# Patient Record
Sex: Female | Born: 1940
Health system: Southern US, Community
[De-identification: ages and names within clinical notes are randomized; demographics above are authoritative.]

## PROBLEM LIST (undated history)

## (undated) DIAGNOSIS — J45909 Unspecified asthma, uncomplicated: Secondary | ICD-10-CM

## (undated) DIAGNOSIS — R519 Headache, unspecified: Secondary | ICD-10-CM

## (undated) DIAGNOSIS — R51 Headache: Secondary | ICD-10-CM

## (undated) DIAGNOSIS — H539 Unspecified visual disturbance: Secondary | ICD-10-CM

## (undated) DIAGNOSIS — T4145XA Adverse effect of unspecified anesthetic, initial encounter: Secondary | ICD-10-CM

## (undated) DIAGNOSIS — T8859XA Other complications of anesthesia, initial encounter: Secondary | ICD-10-CM

## (undated) DIAGNOSIS — R000 Tachycardia, unspecified: Secondary | ICD-10-CM

## (undated) HISTORY — DX: Headache: R51

## (undated) HISTORY — DX: Headache, unspecified: R51.9

## (undated) HISTORY — PX: BREAST LUMPECTOMY: SHX2

## (undated) HISTORY — PX: HAND SURGERY: SHX662

## (undated) HISTORY — PX: TUBAL LIGATION: SHX77

## (undated) HISTORY — PX: OTHER SURGICAL HISTORY: SHX169

## (undated) HISTORY — DX: Unspecified visual disturbance: H53.9

## (undated) HISTORY — PX: FOOT SURGERY: SHX648

---

## 1998-03-17 ENCOUNTER — Ambulatory Visit (HOSPITAL_COMMUNITY): Admission: RE | Admit: 1998-03-17 | Discharge: 1998-03-17 | Payer: Self-pay | Admitting: Internal Medicine

## 1998-03-17 ENCOUNTER — Encounter: Payer: Self-pay | Admitting: Internal Medicine

## 1999-03-18 ENCOUNTER — Encounter: Payer: Self-pay | Admitting: Internal Medicine

## 1999-03-18 ENCOUNTER — Ambulatory Visit (HOSPITAL_COMMUNITY): Admission: RE | Admit: 1999-03-18 | Discharge: 1999-03-18 | Payer: Self-pay | Admitting: Internal Medicine

## 2000-01-13 ENCOUNTER — Encounter: Admission: RE | Admit: 2000-01-13 | Discharge: 2000-01-13 | Payer: Self-pay | Admitting: Internal Medicine

## 2000-01-13 ENCOUNTER — Encounter: Payer: Self-pay | Admitting: Internal Medicine

## 2000-10-01 ENCOUNTER — Other Ambulatory Visit: Admission: RE | Admit: 2000-10-01 | Discharge: 2000-10-01 | Payer: Self-pay | Admitting: Obstetrics and Gynecology

## 2000-11-15 ENCOUNTER — Ambulatory Visit (HOSPITAL_COMMUNITY): Admission: RE | Admit: 2000-11-15 | Discharge: 2000-11-15 | Payer: Self-pay | Admitting: Obstetrics and Gynecology

## 2000-11-15 ENCOUNTER — Encounter: Payer: Self-pay | Admitting: Obstetrics and Gynecology

## 2001-11-19 ENCOUNTER — Other Ambulatory Visit: Admission: RE | Admit: 2001-11-19 | Discharge: 2001-11-19 | Payer: Self-pay | Admitting: Obstetrics and Gynecology

## 2002-12-29 ENCOUNTER — Other Ambulatory Visit: Admission: RE | Admit: 2002-12-29 | Discharge: 2002-12-29 | Payer: Self-pay | Admitting: Obstetrics and Gynecology

## 2003-01-31 ENCOUNTER — Emergency Department (HOSPITAL_COMMUNITY): Admission: EM | Admit: 2003-01-31 | Discharge: 2003-01-31 | Payer: Self-pay | Admitting: Emergency Medicine

## 2003-02-01 ENCOUNTER — Emergency Department (HOSPITAL_COMMUNITY): Admission: EM | Admit: 2003-02-01 | Discharge: 2003-02-01 | Payer: Self-pay | Admitting: Emergency Medicine

## 2004-06-16 ENCOUNTER — Encounter: Admission: RE | Admit: 2004-06-16 | Discharge: 2004-06-16 | Payer: Self-pay | Admitting: Orthopedic Surgery

## 2005-04-07 ENCOUNTER — Ambulatory Visit (HOSPITAL_BASED_OUTPATIENT_CLINIC_OR_DEPARTMENT_OTHER): Admission: RE | Admit: 2005-04-07 | Discharge: 2005-04-07 | Payer: Self-pay | Admitting: Orthopedic Surgery

## 2009-08-29 ENCOUNTER — Other Ambulatory Visit: Payer: Self-pay | Admitting: Emergency Medicine

## 2009-08-29 ENCOUNTER — Encounter: Payer: Self-pay | Admitting: Emergency Medicine

## 2009-08-29 ENCOUNTER — Ambulatory Visit: Payer: Self-pay | Admitting: Diagnostic Radiology

## 2009-08-30 ENCOUNTER — Inpatient Hospital Stay (HOSPITAL_COMMUNITY): Admission: EM | Admit: 2009-08-30 | Discharge: 2009-09-03 | Payer: Self-pay | Admitting: Emergency Medicine

## 2009-08-31 ENCOUNTER — Ambulatory Visit: Payer: Self-pay | Admitting: Internal Medicine

## 2010-06-05 LAB — BASIC METABOLIC PANEL
BUN: 5 mg/dL — ABNORMAL LOW (ref 6–23)
BUN: 6 mg/dL (ref 6–23)
CO2: 29 mEq/L (ref 19–32)
Calcium: 8.8 mg/dL (ref 8.4–10.5)
Calcium: 8.9 mg/dL (ref 8.4–10.5)
Creatinine, Ser: 0.67 mg/dL (ref 0.4–1.2)
GFR calc Af Amer: >60 mL/min (ref >60)
GFR calc non Af Amer: >60 mL/min (ref >60)
GFR calc non Af Amer: >60 mL/min (ref >60)
Glucose, Bld: 160 mg/dL — ABNORMAL HIGH (ref 70–99)
Glucose, Bld: 164 mg/dL — ABNORMAL HIGH (ref 70–99)
Potassium: 3.7 mEq/L (ref 3.5–5.1)
Sodium: 137 mEq/L (ref 135–145)

## 2010-06-05 LAB — GLUCOSE, CAPILLARY
Glucose-Capillary: 126 mg/dL — ABNORMAL HIGH (ref 70–99)
Glucose-Capillary: 150 mg/dL — ABNORMAL HIGH (ref 70–99)
Glucose-Capillary: 175 mg/dL — ABNORMAL HIGH (ref 70–99)
Glucose-Capillary: 180 mg/dL — ABNORMAL HIGH (ref 70–99)
Glucose-Capillary: 195 mg/dL — ABNORMAL HIGH (ref 70–99)
Glucose-Capillary: 224 mg/dL — ABNORMAL HIGH (ref 70–99)
Glucose-Capillary: 265 mg/dL — ABNORMAL HIGH (ref 70–99)

## 2010-06-05 LAB — AFB CULTURE WITH SMEAR (NOT AT ARMC)

## 2010-06-05 LAB — CBC
HCT: 34.7 % — ABNORMAL LOW (ref 36.0–46.0)
MCHC: 34 g/dL (ref 30.0–36.0)
MCHC: 34.5 g/dL (ref 30.0–36.0)
MCHC: 34.5 g/dL (ref 30.0–36.0)
MCV: 88.9 fL (ref 78.0–100.0)
Platelets: 112 10*3/uL — ABNORMAL LOW (ref 150–400)
Platelets: 120 10*3/uL — ABNORMAL LOW (ref 150–400)
RDW: 13.2 % (ref 11.5–15.5)
RDW: 13.2 % (ref 11.5–15.5)
RDW: 13.3 % (ref 11.5–15.5)

## 2010-06-05 LAB — CARDIAC PANEL(CRET KIN+CKTOT+MB+TROPI)
CK, MB: 1.8 ng/mL (ref 0.3–4.0)
Relative Index: INVALID (ref 0.0–2.5)
Total CK: 52 U/L (ref 7–177)
Total CK: 77 U/L (ref 7–177)
Troponin I: 0.01 ng/mL (ref 0.00–0.06)

## 2010-06-05 LAB — FUNGUS CULTURE W SMEAR: Fungal Smear: NONE SEEN

## 2010-06-05 LAB — WOUND CULTURE: Culture: NO GROWTH

## 2010-06-05 LAB — ANAEROBIC CULTURE

## 2010-06-06 LAB — DIFFERENTIAL
Basophils Absolute: 0 10*3/uL (ref 0.0–0.1)
Basophils Relative: 0 % (ref 0–1)
Basophils Relative: 1 % (ref 0–1)
Eosinophils Absolute: 0.1 10*3/uL (ref 0.0–0.7)
Eosinophils Absolute: 0.1 10*3/uL (ref 0.0–0.7)
Eosinophils Relative: 2 % (ref 0–5)
Monocytes Absolute: 0.4 10*3/uL (ref 0.1–1.0)
Monocytes Relative: 6 % (ref 3–12)
Neutrophils Relative %: 54 % (ref 43–77)

## 2010-06-06 LAB — CULTURE, ROUTINE-ABSCESS

## 2010-06-06 LAB — CBC
HCT: 41.3 % (ref 36.0–46.0)
Hemoglobin: 11.7 g/dL — ABNORMAL LOW (ref 12.0–15.0)
MCHC: 33.8 g/dL (ref 30.0–36.0)
MCHC: 33 g/dL (ref 30.0–36.0)
MCHC: 33.5 g/dL (ref 30.0–36.0)
MCV: 89.3 fL (ref 78.0–100.0)
MCV: 87.3 fL (ref 78.0–100.0)
MCV: 88.2 fL (ref 78.0–100.0)
Platelets: 151 10*3/uL (ref 150–400)
RBC: 3.67 MIL/uL — ABNORMAL LOW (ref 3.87–5.11)
RBC: 3.89 MIL/uL (ref 3.87–5.11)
RBC: 4.57 MIL/uL (ref 3.87–5.11)
RDW: 13.3 % (ref 11.5–15.5)
RDW: 12.4 % (ref 11.5–15.5)
WBC: 6.9 10*3/uL (ref 4.0–10.5)

## 2010-06-06 LAB — BASIC METABOLIC PANEL
BUN: 15 mg/dL (ref 6–23)
BUN: 17 mg/dL (ref 6–23)
CO2: 30 mEq/L (ref 19–32)
Chloride: 102 mEq/L (ref 96–112)
Chloride: 103 mEq/L (ref 96–112)
Creatinine, Ser: 0.8 mg/dL (ref 0.4–1.2)
GFR calc Af Amer: >60 mL/min (ref >60)
Glucose, Bld: 189 mg/dL — ABNORMAL HIGH (ref 70–99)
Potassium: 4.8 mEq/L (ref 3.5–5.1)

## 2010-06-06 LAB — GLUCOSE, CAPILLARY
Glucose-Capillary: 107 mg/dL — ABNORMAL HIGH (ref 70–99)
Glucose-Capillary: 138 mg/dL — ABNORMAL HIGH (ref 70–99)
Glucose-Capillary: 152 mg/dL — ABNORMAL HIGH (ref 70–99)
Glucose-Capillary: 180 mg/dL — ABNORMAL HIGH (ref 70–99)
Glucose-Capillary: 207 mg/dL — ABNORMAL HIGH (ref 70–99)

## 2010-06-06 LAB — CULTURE, BLOOD (ROUTINE X 2)

## 2010-06-06 LAB — AFB CULTURE WITH SMEAR (NOT AT ARMC): Acid Fast Smear: NONE SEEN

## 2010-06-06 LAB — ANAEROBIC CULTURE: Gram Stain: NONE SEEN

## 2010-06-06 LAB — FUNGUS CULTURE W SMEAR

## 2010-08-05 NOTE — Op Note (Signed)
NAME:  Anna Norris, Anna Norris                 ACCOUNT NO.:  [id number]   MEDICAL RECORD NO.:  [id number]          PATIENT TYPE:  AMB   LOCATION:  DSC                          FACILITY:  [facility]   PHYSICIAN:  Harvie Junior, M.D.   DATE OF BIRTH:  March 13, 1941   DATE OF PROCEDURE:  04/07/2005  DATE OF DISCHARGE:                                 OPERATIVE REPORT   PREOPERATIVE DIAGNOSIS:  1.  Peroneal nerve impingement, left.  2.  Morton's neuroma 2-3 interspace.  3.  Morton's neuroma 3-4 interspace.   POSTOPERATIVE DIAGNOSIS:  1.  Peroneal nerve impingement, left.  2.  Morton's neuroma 2-3 interspace.  3.  Morton's neuroma 3-4 interspace.   PROCEDURE:  1.  Peroneal nerve decompression, left.  2.  Excision of Morton's neuroma 2-3 interspace.  3.  Excision of Morton's neuroma 3-4 interspace.   SURGEON:  Harvie Junior, M.D.   ASSISTANT:  Marshia Ly, P.A.-C.   ANESTHESIA:  General .   BRIEF HISTORY:  70 year old female with a long history of having significant  lateral and dorsal foot pain.  She ultimately had been evaluated and for a  long period of time treated conservatively.  She had had multiple injections  in the 2-3 and 3-4 interspace because of neuromatous type pain.  She also  had had proximal pain which completely did not make sense.  We ultimately  evaluated her with EMG and EMG showed that she had compression of the  peroneal nerve.  We felt that this needed to be decompressed and she was  brought to the operating room for this procedure.   DESCRIPTION OF PROCEDURE:  The patient was taken to the operating room and  after adequate anesthesia was obtained with a general anesthetic, the  patient was placed supine upon the operating table.  The left leg was  prepped and draped in the usual sterile fashion.  Following this, the leg  was exsanguinated and blood pressure tourniquet was inflated to 300 mmHg.  Following this, a curved incision was made just distal to the head of the  fibula and subcutaneous tissue were dissected down to the level of the  investing fascia.  The nerve was palpated below the fascia.  A small rent  was made in the fascia and then the scissors were used to open the fascia  proximally and distally.  There was a portion of hamstring tendon in this  area that did appear to be compressing the nerve, especially as it came into  full extension and this was released somewhat on the lateral side.  The  nerve was tracked proximally well into the popliteal space and, at this  point, the nerve was completely free distally and there were some adhesions  distally through the head of the perinei.  The nerve was freed completely  through this area to where it started to branch.  Once the nerve was  completely freed up, it was irrigated and suctioned dry.  The skin was  closed with a combination of 3-0 Vicryl and 3-0 Maxon running suture.  Benzoin and Steri-Strips were  applied.   Attention was turned to the foot.  An incision was made over the third  metatarsal and the incision was moved into the 2-3 interspace.  The  subcutaneous tissues were dissected down to the level of the intermetatarsal  ligament which was divided.  The neuromatous tissue underneath was dissected  proximally and the contribution from medially and laterally was found  proximally and tracked as far proximally as we could and then released down  to the entanglement in the 2-3 interspace which was divided and then the  entanglement was tracked distally until its branches all the way out into  the second and third toes which were divided.  This neuromatous tissue was  taken out of the leg at that point.  Attention was turned to the 3-4  interspace and a similar procedure was performed, releasing the  intermetatarsal ligament and the neuromatous tissue was traced proximally  under the third and fourth toe and down into the third and fourth toe  distally.  The neuromatous tissue was  excised and removed.  At this point,  the wounds were copiously irrigated and suctioned dry.  The skin was closed  with nylon interrupted suture.  A sterile compressive dressing was applied  as well as a hard sole shoe.  A sterile compressive depressing was applied  around the knee and the patient was taken to the recovery room and noted to  be in satisfactory condition.  Estimated blood loss was none.      Harvie Junior, M.D.  Electronically Signed     JLG/MEDQ  D:  04/07/2005  T:  04/07/2005  Job:  [id number]

## 2011-10-06 ENCOUNTER — Encounter (HOSPITAL_BASED_OUTPATIENT_CLINIC_OR_DEPARTMENT_OTHER): Payer: Self-pay | Admitting: *Deleted

## 2011-10-06 ENCOUNTER — Emergency Department (HOSPITAL_BASED_OUTPATIENT_CLINIC_OR_DEPARTMENT_OTHER)
Admission: EM | Admit: 2011-10-06 | Discharge: 2011-10-06 | Disposition: A | Discharge status: Home / Self Care | Payer: Medicare Other | Attending: Emergency Medicine | Admitting: Emergency Medicine

## 2011-10-06 DIAGNOSIS — L0390 Cellulitis, unspecified: Secondary | ICD-10-CM

## 2011-10-06 DIAGNOSIS — E119 Type 2 diabetes mellitus without complications: Secondary | ICD-10-CM | POA: Insufficient documentation

## 2011-10-06 DIAGNOSIS — L03019 Cellulitis of unspecified finger: Secondary | ICD-10-CM | POA: Insufficient documentation

## 2011-10-06 DIAGNOSIS — L02519 Cutaneous abscess of unspecified hand: Secondary | ICD-10-CM | POA: Insufficient documentation

## 2011-10-06 HISTORY — DX: Tachycardia, unspecified: R00.0

## 2011-10-06 HISTORY — DX: Unspecified asthma, uncomplicated: J45.909

## 2011-10-06 MED ORDER — CLINDAMYCIN HCL 300 MG PO CAPS: 300.0000 mg | ORAL_CAPSULE | Freq: Three times a day (TID) | ORAL | Status: AC

## 2011-10-06 NOTE — ED Provider Notes (Signed)
Medical screening examination/treatment/procedure(s) were performed by non-physician practitioner and as supervising physician I was immediately available for consultation/collaboration.   Charles B. Sheldon, MD 10/06/11 1541 

## 2011-10-06 NOTE — ED Provider Notes (Signed)
History     CSN: [id number]  Arrival date & time 10/06/11  1145   First MD Initiated Contact with Patient 10/06/11 1231      Chief Complaint  Patient presents with  . Insect Bite    (Consider location/radiation/quality/duration/timing/severity/associated sxs/prior treatment) HPI Comments: Pt states that she was stung by an insect or hornet and she has a history of previous reactions:pt states that he finger has progressively gotten more red over the last 2 day:pt has tried benadryl without much relief  Patient is a 71 y.o. female presenting with animal bite. The history is provided by the patient. No language interpreter was used.  Animal Bite  Episode onset: 2 days ago. The incident occurred at home. There is an injury to the left index finger. The pain is mild. It is unlikely that a foreign body is present. There have been no prior injuries to these areas.    Past Medical History  Diagnosis Date  . Tachycardia     no problems now.  . Asthma   . Diabetes mellitus     Past Surgical History  Procedure Date  . Uterine cyst   . Tubal ligation   . Hand surgery   . Breast lumpectomy     No family history on file.  History  Substance Use Topics  . Smoking status: Never Smoker   . Smokeless tobacco: Not on file  . Alcohol Use: Yes     occassionally    OB History    Grav Para Term Preterm Abortions TAB SAB Ect Mult Living                  Review of Systems  Constitutional: Negative.   Respiratory: Negative.   Cardiovascular: Negative.   Neurological: Negative.     Allergies  Yellow dyes (non-tartrazine)  Home Medications   Current Outpatient Rx  Name Route Sig Dispense Refill  . GLIMEPIRIDE 2 MG PO TABS Oral Take 2 mg by mouth daily before breakfast.    . METFORMIN HCL 500 MG PO TABS Oral Take 500 mg by mouth 2 (two) times daily with a meal.      BP 163/74  Pulse 78  Temp 98.4 F (36.9 C) (Oral)  Resp 20  Ht 5\' 6"  (1.676 m)  Wt 170 lb (77.111 kg)   BMI 27.44 kg/m2  SpO2 98%  Physical Exam  Nursing note and vitals reviewed. Constitutional: She is oriented to person, place, and time. She appears well-developed and well-nourished.  Cardiovascular: Normal rate and regular rhythm.   Pulmonary/Chest: Effort normal and breath sounds normal.  Musculoskeletal: Normal range of motion.  Neurological: She is alert and oriented to person, place, and time. She exhibits normal muscle tone. Coordination normal.  Skin:       Pt has localized redness swelling and warmth to the right index finger:pt has full rom:cap refill intact    ED Course  Procedures (including critical care time)  Labs Reviewed - No data to display No results found.   1. Cellulitis       MDM  Pt instructed to continue antihistamine and is being treated for cellulitis        Teressa Lower, NP 10/06/11 1304

## 2011-10-06 NOTE — ED Notes (Signed)
Patient states she was stung by a bee 2 days ago.  States she is allergic to bee stings, has an expired epi pen and she did not use it.  States she had a similar episode 2 years ago and developed cellulitis and is concerned.  Has been using her antihistamines.  Right index finger with redness and swelling.

## 2011-10-06 NOTE — ED Notes (Signed)
Pt states she was stung by a wasp or bee on Wednesday and now has swelling redness, itching and pain to right 2nd, 3rd digit of right hand.  PMS intact.

## 2012-04-25 ENCOUNTER — Emergency Department (HOSPITAL_BASED_OUTPATIENT_CLINIC_OR_DEPARTMENT_OTHER): Payer: Medicare Other

## 2012-04-25 ENCOUNTER — Encounter (HOSPITAL_BASED_OUTPATIENT_CLINIC_OR_DEPARTMENT_OTHER): Payer: Self-pay | Admitting: Emergency Medicine

## 2012-04-25 ENCOUNTER — Inpatient Hospital Stay (HOSPITAL_BASED_OUTPATIENT_CLINIC_OR_DEPARTMENT_OTHER)
Admission: EM | Admit: 2012-04-25 | Discharge: 2012-04-26 | DRG: 069 | Disposition: A | Discharge status: Home / Self Care | Payer: Medicare Other | Attending: Internal Medicine | Admitting: Internal Medicine

## 2012-04-25 DIAGNOSIS — E119 Type 2 diabetes mellitus without complications: Secondary | ICD-10-CM | POA: Diagnosis present

## 2012-04-25 DIAGNOSIS — R4789 Other speech disturbances: Secondary | ICD-10-CM | POA: Diagnosis present

## 2012-04-25 DIAGNOSIS — J45909 Unspecified asthma, uncomplicated: Secondary | ICD-10-CM | POA: Diagnosis present

## 2012-04-25 DIAGNOSIS — G459 Transient cerebral ischemic attack, unspecified: Secondary | ICD-10-CM | POA: Diagnosis present

## 2012-04-25 DIAGNOSIS — G458 Other transient cerebral ischemic attacks and related syndromes: Principal | ICD-10-CM | POA: Diagnosis present

## 2012-04-25 DIAGNOSIS — I6529 Occlusion and stenosis of unspecified carotid artery: Secondary | ICD-10-CM | POA: Diagnosis present

## 2012-04-25 DIAGNOSIS — S0990XA Unspecified injury of head, initial encounter: Secondary | ICD-10-CM

## 2012-04-25 DIAGNOSIS — Z7982 Long term (current) use of aspirin: Secondary | ICD-10-CM

## 2012-04-25 DIAGNOSIS — E785 Hyperlipidemia, unspecified: Secondary | ICD-10-CM | POA: Diagnosis present

## 2012-04-25 DIAGNOSIS — R4182 Altered mental status, unspecified: Secondary | ICD-10-CM | POA: Diagnosis present

## 2012-04-25 DIAGNOSIS — Z79899 Other long term (current) drug therapy: Secondary | ICD-10-CM

## 2012-04-25 DIAGNOSIS — G43809 Other migraine, not intractable, without status migrainosus: Secondary | ICD-10-CM | POA: Diagnosis present

## 2012-04-25 LAB — COMPREHENSIVE METABOLIC PANEL
Alkaline Phosphatase: 96 U/L (ref 39–117)
BUN: 9 mg/dL (ref 6–23)
CO2: 26 mEq/L (ref 19–32)
GFR calc Af Amer: >90 mL/min (ref >90)
GFR calc non Af Amer: 90 mL/min — ABNORMAL LOW (ref >90)
Glucose, Bld: 131 mg/dL — ABNORMAL HIGH (ref 70–99)
Potassium: 3.7 mEq/L (ref 3.5–5.1)
Total Bilirubin: 0.3 mg/dL (ref 0.3–1.2)
Total Protein: 7.8 g/dL (ref 6.0–8.3)

## 2012-04-25 LAB — CBC WITH DIFFERENTIAL/PLATELET
Eosinophils Absolute: 0.1 10*3/uL (ref 0.0–0.7)
Hemoglobin: 13.7 g/dL (ref 12.0–15.0)
Lymphocytes Relative: 35 % (ref 12–46)
Lymphs Abs: 2.1 10*3/uL (ref 0.7–4.0)
MCH: 29.1 pg (ref 26.0–34.0)
MCV: 84.5 fL (ref 78.0–100.0)
Monocytes Relative: 5 % (ref 3–12)
Neutrophils Relative %: 57 % (ref 43–77)
RBC: 4.71 MIL/uL (ref 3.87–5.11)

## 2012-04-25 LAB — URINALYSIS, ROUTINE W REFLEX MICROSCOPIC
Bilirubin Urine: NEGATIVE
Glucose, UA: NEGATIVE mg/dL
Hgb urine dipstick: NEGATIVE
Ketones, ur: NEGATIVE mg/dL
Protein, ur: NEGATIVE mg/dL
pH: 6 (ref 5.0–8.0)

## 2012-04-25 LAB — PROTIME-INR: Prothrombin Time: 11.8 seconds (ref 11.6–15.2)

## 2012-04-25 LAB — URINE MICROSCOPIC-ADD ON

## 2012-04-25 MED ORDER — ONDANSETRON HCL 4 MG/2ML IJ SOLN: 4.0000 mg | Freq: Three times a day (TID) | INTRAMUSCULAR | Status: AC | PRN

## 2012-04-25 NOTE — Progress Notes (Signed)
Patient got admitted to unit. MD paged upon arrival. No orders available at this time

## 2012-04-25 NOTE — ED Notes (Signed)
Pt fell on Jan 26th.  Missed last step in the house and fell forward, hitting left side of head on closet door.  Has stayed home sitting around since then because of pain in her legs and feet.  Since then has been having headaches.  Has had episode of unintelligible speech a couple days ago.  Shooting lights in right eye. Visual disturbances.

## 2012-04-25 NOTE — ED Provider Notes (Signed)
History     CSN: [id number]  Arrival date & time 04/25/12  1757   First MD Initiated Contact with Patient 04/25/12 1834      Chief Complaint  Patient presents with  . Fall    (Consider location/radiation/quality/duration/timing/severity/associated sxs/prior treatment) HPI Comments: Patient states that she had a mechanical fall on January 26 down one step in her house falling forward hitting her head on a closet door. She been having pain in her feet since then. For the past 4 days she said daily headaches in the morning that resolved after 2 hours. No associated nausea, vomiting, fever or chills. No chest pain or shortness of breath. She is not initially evaluated after the fall. Two days ago patient had difficulty speaking, her husband noticed that she was talking gibberish which resolved after a few minutes. Patient is also having intermittent flashes of light in her right eye 2 days ago when she was doing a crossword puzzle she had loss of her peripheral vision for a few minutes which is also now resolved.  The history is provided by the patient.    Past Medical History  Diagnosis Date  . Tachycardia     no problems now.  . Asthma   . Diabetes mellitus     A1C < 7    Past Surgical History  Procedure Date  . Uterine cyst   . Tubal ligation   . Hand surgery   . Breast lumpectomy   . Foot surgery     Family History  Problem Relation Age of Onset  . Stroke Neg Hx     History  Substance Use Topics  . Smoking status: Never Smoker   . Smokeless tobacco: Not on file  . Alcohol Use: Yes     Comment: occassionally    OB History    Grav Para Term Preterm Abortions TAB SAB Ect Mult Living                  Review of Systems  Constitutional: Negative for fever, activity change and appetite change.  HENT: Negative for congestion and rhinorrhea.   Eyes: Positive for visual disturbance.  Respiratory: Negative for cough, chest tightness and shortness of breath.    Cardiovascular: Negative for chest pain.  Gastrointestinal: Negative for nausea, vomiting and abdominal pain.  Genitourinary: Negative for vaginal discharge.  Musculoskeletal: Negative for back pain.  Neurological: Positive for dizziness, light-headedness and headaches. Negative for weakness.   A complete 10 system review of systems was obtained and all systems are negative except as noted in the HPI and PMH.   Allergies  Shellfish allergy and Yellow dyes (non-tartrazine)  Home Medications   No current outpatient prescriptions on file.  BP 155/79  Pulse 80  Temp 97.5 F (36.4 C) (Oral)  Resp 18  Ht 5\' 6"  (1.676 m)  Wt 170 lb 6.7 oz (77.3 kg)  BMI 27.51 kg/m2  SpO2 99%  Physical Exam  Constitutional: She is oriented to person, place, and time. She appears well-developed and well-nourished. No distress.  HENT:  Head: Normocephalic and atraumatic.  Mouth/Throat: Oropharynx is clear and moist. No oropharyngeal exudate.  Eyes: Conjunctivae normal and EOM are normal. Pupils are equal, round, and reactive to light.  Neck: Normal range of motion. Neck supple.  Cardiovascular: Normal rate, regular rhythm and normal heart sounds.   No murmur heard. Pulmonary/Chest: Effort normal and breath sounds normal. No respiratory distress.  Abdominal: Soft. There is no tenderness. There is no rebound and  no guarding.  Musculoskeletal: Normal range of motion. She exhibits no edema and no tenderness.  Neurological: She is alert and oriented to person, place, and time. No cranial nerve deficit. She exhibits normal muscle tone. Coordination normal.       Cranial nerves 2-12 intact, 5 out of 5 strength throughout, no ataxia finger to nose, no pronator drift, visual fields full to confrontation  Skin: Skin is warm.    ED Course  Procedures (including critical care time)  Labs Reviewed  COMPREHENSIVE METABOLIC PANEL - Abnormal; Notable for the following:    Glucose, Bld 131 (*)     GFR calc non  Af Amer 90 (*)     All other components within normal limits  URINALYSIS, ROUTINE W REFLEX MICROSCOPIC - Abnormal; Notable for the following:    Leukocytes, UA SMALL (*)     All other components within normal limits  CBC WITH DIFFERENTIAL  PROTIME-INR  TROPONIN I  URINE MICROSCOPIC-ADD ON  HEMOGLOBIN A1C  LIPID PANEL   Dg Chest 2 View  04/25/2012  *RADIOLOGY REPORT*  Clinical Data: Chest pain, weakness and dizziness.  CHEST - 2 VIEW  Comparison: PA and lateral chest 12/15/2010.  Findings: Lungs are clear.  Heart size is normal.  There is no pneumothorax or pleural effusion.  Degenerative change about the shoulders and in the mid thoracic spine is noted.  IMPRESSION: No acute finding.  Stable compared to prior exam.   Original Report Authenticated By: Holley Dexter, M.D.    Ct Head Wo Contrast  04/25/2012  *RADIOLOGY REPORT*  Clinical Data:  Status post fall 10 days ago with a blow to the left side of the head.  Confusion and visual disturbance.  CT HEAD WITHOUT CONTRAST CT CERVICAL SPINE WITHOUT CONTRAST  Technique:  Multidetector CT imaging of the head and cervical spine was performed following the standard protocol without intravenous contrast.  Multiplanar CT image reconstructions of the cervical spine were also generated.  Comparison:   None  CT HEAD  Findings: The brain appears normal without infarct, hemorrhage, mass lesion, mass effect, midline shift or abnormal extra-axial fluid collection.  There is no hydrocephalus or pneumocephalus. The calvarium is intact.  Imaged paranasal sinuses and mastoid air cells are clear.  IMPRESSION: Negative exam.  CT CERVICAL SPINE  Findings: There is no fracture or subluxation of the cervical spine.  Loss of disc space height appears worst at C5-6 and C6-7. Lung apices are clear.  IMPRESSION: No acute finding.   Original Report Authenticated By: Holley Dexter, M.D.    Ct Cervical Spine Wo Contrast  04/25/2012  *RADIOLOGY REPORT*  Clinical Data:  Status  post fall 10 days ago with a blow to the left side of the head.  Confusion and visual disturbance.  CT HEAD WITHOUT CONTRAST CT CERVICAL SPINE WITHOUT CONTRAST  Technique:  Multidetector CT imaging of the head and cervical spine was performed following the standard protocol without intravenous contrast.  Multiplanar CT image reconstructions of the cervical spine were also generated.  Comparison:   None  CT HEAD  Findings: The brain appears normal without infarct, hemorrhage, mass lesion, mass effect, midline shift or abnormal extra-axial fluid collection.  There is no hydrocephalus or pneumocephalus. The calvarium is intact.  Imaged paranasal sinuses and mastoid air cells are clear.  IMPRESSION: Negative exam.  CT CERVICAL SPINE  Findings: There is no fracture or subluxation of the cervical spine.  Loss of disc space height appears worst at C5-6 and C6-7. Lung apices  are clear.  IMPRESSION: No acute finding.   Original Report Authenticated By: Holley Dexter, M.D.    Dg Foot Complete Left  04/25/2012  *RADIOLOGY REPORT*  Clinical Data: Foot pain since a fall 04/14/2012.  LEFT FOOT - COMPLETE 3+ VIEW  Comparison: None.  Findings: No acute bony or joint abnormality is identified.  Soft tissue structures are unremarkable.  IMPRESSION: Negative exam.   Original Report Authenticated By: Holley Dexter, M.D.    Dg Foot Complete Right  04/25/2012  *RADIOLOGY REPORT*  Clinical Data: Foot pain since a fall 04/14/2012.  RIGHT FOOT COMPLETE - 3+ VIEW  Comparison: None.  Findings: No acute bony or joint abnormality is identified.  Soft tissues are unremarkable.  IMPRESSION: Negative exam.   Original Report Authenticated By: Holley Dexter, M.D.      1. TIA (transient ischemic attack)   2. Head injury   3. DM2 (diabetes mellitus, type 2)       MDM  Mechanical fall on January 26 and headaches for the past 4 days. Now with episode of aphasia 2 days ago with transient visual changes. Nonfocal neurological exam  today.  CT negative.  NO apparent injuries from fall. Concern for post concussive syndrome though patient's visual change and aphasia are more concerning for TIA.  Admission d/w Dr. Julian Reil at [facility].    Date: 04/25/2012  Rate: 79  Rhythm: normal sinus rhythm  QRS Axis: normal  Intervals: normal  ST/T Wave abnormalities: normal  Conduction Disutrbances:none  Narrative Interpretation:   Old EKG Reviewed: none available      Glynn Octave, MD 04/26/12 0151

## 2012-04-26 ENCOUNTER — Inpatient Hospital Stay (HOSPITAL_COMMUNITY): Payer: Medicare Other

## 2012-04-26 ENCOUNTER — Encounter (HOSPITAL_COMMUNITY): Payer: Self-pay | Admitting: Internal Medicine

## 2012-04-26 DIAGNOSIS — G459 Transient cerebral ischemic attack, unspecified: Secondary | ICD-10-CM

## 2012-04-26 DIAGNOSIS — R4182 Altered mental status, unspecified: Secondary | ICD-10-CM

## 2012-04-26 DIAGNOSIS — E119 Type 2 diabetes mellitus without complications: Secondary | ICD-10-CM

## 2012-04-26 LAB — LIPID PANEL
HDL: 63 mg/dL (ref >39)
LDL Cholesterol: 51 mg/dL (ref 0–99)

## 2012-04-26 LAB — HEMOGLOBIN A1C: Hgb A1c MFr Bld: 7.5 % — ABNORMAL HIGH (ref <5.7)

## 2012-04-26 LAB — GLUCOSE, CAPILLARY: Glucose-Capillary: 173 mg/dL — ABNORMAL HIGH (ref 70–99)

## 2012-04-26 MED ORDER — ATORVASTATIN CALCIUM 20 MG PO TABS
20.0000 mg | ORAL_TABLET | Freq: Every day | ORAL | Status: DC
  Filled 2012-04-26: qty 1

## 2012-04-26 MED ORDER — METFORMIN HCL 500 MG PO TABS
500.0000 mg | ORAL_TABLET | Freq: Two times a day (BID) | ORAL | Status: DC
  Administered 2012-04-26: 500 mg via ORAL
  Filled 2012-04-26 (×3): qty 1

## 2012-04-26 MED ORDER — HEPARIN SODIUM (PORCINE) 5000 UNIT/ML IJ SOLN
5000.0000 [IU] | Freq: Three times a day (TID) | INTRAMUSCULAR | Status: DC
  Administered 2012-04-26 (×2): 5000 [IU] via SUBCUTANEOUS
  Filled 2012-04-26 (×4): qty 1

## 2012-04-26 MED ORDER — ASPIRIN 325 MG PO TABS: 325.0000 mg | ORAL_TABLET | Freq: Every day | ORAL | Status: DC

## 2012-04-26 MED ORDER — LORAZEPAM 2 MG/ML IJ SOLN
0.5000 mg | Freq: Once | INTRAMUSCULAR | Status: AC
  Administered 2012-04-26: 0.5 mg via INTRAVENOUS

## 2012-04-26 MED ORDER — METFORMIN HCL 500 MG PO TABS
1000.0000 mg | ORAL_TABLET | Freq: Two times a day (BID) | ORAL | Status: DC
  Filled 2012-04-26: qty 2

## 2012-04-26 MED ORDER — ASPIRIN 325 MG PO TABS
325.0000 mg | ORAL_TABLET | Freq: Every day | ORAL | Status: DC
  Administered 2012-04-26: 325 mg via ORAL
  Filled 2012-04-26: qty 1

## 2012-04-26 MED ORDER — GLIMEPIRIDE 2 MG PO TABS
2.0000 mg | ORAL_TABLET | Freq: Every day | ORAL | Status: DC
  Administered 2012-04-26: 2 mg via ORAL
  Filled 2012-04-26 (×2): qty 1

## 2012-04-26 NOTE — Consult Note (Signed)
Reason for Consult: Transient vision loss with difficulty speaking Referring Physician: Lyda Perone  CC: Vision change, difficulty speaking.   History is obtained from:Patient  HPI: Anna Norris is a 72 y.o. female who had an episode 2 days ago where she was reading a crossword puzzle and noticed that she was looking at letters, she would see the other letters around it disappear. She is not sure if it is one side or both. She does have a crossword down, and trying to say something to her husband but it came out gibberish. She states that she knows what she wanted to say, and did not have any trouble understanding her husband, but was unable to speak.  He asked her if she wanted to go to the emergency room, and she was able to say give me a minute, but then did not try to speak for quite a while. When she did she was able to speak normally.  She also states that she has had some flashing of light in her right visual field. Of note, she did fall on January 26. She struck her head at that time, but it did not hurt much. One week later, she began having mild headaches which seem to resolve with Tylenol.  She does not have a history of migraine. She does have a history of intermittent fluttering heartbeats.  LKW: 2 days ago tpa given: no, symptoms resolved  ROS: A 14 point ROS was performed and is negative except as noted in the HPI.  Past Medical History  Diagnosis Date  . Tachycardia     no problems now.  . Asthma   . Diabetes mellitus     A1C < 7    Family History: No history of strokes or seizures  Social History: Tob: Denies  Exam: Current vital signs: BP 155/79  Pulse 80  Temp 97.5 F (36.4 C) (Oral)  Resp 18  Ht 5\' 6"  (1.676 m)  Wt 77.3 kg (170 lb 6.7 oz)  BMI 27.51 kg/m2  SpO2 99% Vital signs in last 24 hours: Temp:  [97.5 F (36.4 C)-98.4 F (36.9 C)] 97.5 F (36.4 C) (02/06 2307) Pulse Rate:  [80-89] 80  (02/06 2307) Resp:  [16-18] 18  (02/06 2307) BP:  (143-155)/(69-91) 155/79 mmHg (02/06 2307) SpO2:  [96 %-99 %] 99 % (02/06 2307) Weight:  [77.111 kg (170 lb)-77.3 kg (170 lb 6.7 oz)] 77.3 kg (170 lb 6.7 oz) (02/06 2307)  General: In bed, NAD CV: Regular rate and rhythm Mental Status: Patient is awake, alert, oriented to person, place, month, year, and situation. Immediate and remote memory are intact. Patient is able to give a clear and coherent history. Able to spell world backwards without difficulty Ablet o give # of quarters in $2.75 Cranial Nerves: II: Visual Fields are full. Pupils are equal, round, and reactive to light.  Discs are difficult to visualize. III,IV, VI: EOMI without ptosis or diploplia.  V: Facial sensation is symmetric to temperature VII: Facial movement is symmetric.  VIII: hearing is intact to voice X: Uvula elevates symmetrically XI: Shoulder shrug is symmetric. XII: tongue is midline without atrophy or fasciculations.  Motor: Tone is normal. Bulk is normal. 5/5 strength was present in all four extremities.  Sensory: Sensation is symmetric to light touch and temperature in the arms and legs. Deep Tendon Reflexes: 2+ and symmetric in the biceps and patellae.  Cerebellar: FNF  are intact bilaterally Gait: Patient has a stable casual gait, though mildly antalgic.  I have reviewed labs in epic and the results pertinent to this consultation are: CMP, CBC - unremarkable.   I have reviewed the images obtained:CT head - no acute findings.   Impression: 72 year old female with transient visual change accompanied by inability to speak. This is most consistent with TIA. The occasional flashes of light are unusual, and it may be worth doing an EEG though I feel that this will likely be very low yield.   Recommendations: 1. HgbA1c, fasting lipid panel 2. MRI, MRA  of the brain without contrast 3. PT consult, OT consult, Speech consult 4. Echocardiogram 5. Carotid dopplers 6. Prophylactic  therapy-Antiplatelet med: Aspirin - dose 325mg  7. Risk factor modification 8. Telemetry monitoring 9. Frequent neuro checks 10. EEG   Ritta Slot, MD [facility] [phone]  If 7pm- 7am, please page neurology on call at [phone].

## 2012-04-26 NOTE — Procedures (Signed)
EEG NUMBER:  [id number].  REFERRING PHYSICIAN:  Dr. Laurence Slate.  INDICATION FOR STUDY:  A 72 year old lady with transient visual changes, as well as transient difficulty with speech output.  Study is being performed to rule out possible focal seizure disorder.  DESCRIPTION:  This is a routine EEG recording performed during wakefulness and during sleep.  Predominant background activity during wakefulness consisted of diffuse symmetrical low amplitude 20-25 Hz beta activity.  Occasional brief occurrences of 9 Hz alpha rhythm were recorded from the posterior head regions.  Photic stimulation was not performed.  Hyperventilation was not performed.  No epileptiform discharges were recorded.  There were no areas of abnormal slowing. During sleep, there were symmetrical vertex waves, sleep spindles, and K complexes.  INTERPRETATION:  This is a normal EEG recording during wakefulness and during sleep.  The predominance of low-amplitude beta activity is most likely related to benzodiazepine medication that she received earlier today.  There was no evidence of an epileptic disorder demonstrated.     Noel Christmas, MD    [id number] D:  04/26/2012 14:38:26  T:  04/26/2012 22:35:03  Job #:  [id number]

## 2012-04-26 NOTE — Progress Notes (Signed)
  Echocardiogram 2D Echocardiogram has been performed.  Cathie Beams 04/26/2012, 8:58 AM

## 2012-04-26 NOTE — Progress Notes (Signed)
*  PRELIMINARY RESULTS* Vascular Ultrasound Carotid Duplex (Doppler) has been completed.  Preliminary findings: Right = 40-59% ICA stenosis. Left = <40% ICA stenosis. Antegrade vertebral flow.  Farrel Demark, RDMS, RVT 04/26/2012, 9:28 AM

## 2012-04-26 NOTE — Progress Notes (Signed)
Stroke Team Progress Note  HISTORY  Anna Norris is a 72 y.o. female who had an episode 2 days ago where she was reading a crossword puzzle and noticed that she was looking at letters, she would see the other letters around it disappear. She is not sure if it is one side or both. She does have a crossword down, and trying to say something to her husband but it came out gibberish. She states that she knows what she wanted to say, and did not have any trouble understanding her husband, but was unable to speak.  He asked her if she wanted to go to the emergency room, and she was able to say give me a minute, but then did not try to speak for quite a while. When she did she was able to speak normally.  She also states that she has had some flashing of light in her right visual field. Of note, she did fall on January 26. She struck her head at that time, but it did not hurt much. One week later, she began having mild headaches which seem to resolve with Tylenol.  She does not have a history of migraine. She does have a history of intermittent fluttering heartbeats.  Patient was not a TPA candidate secondary to delay in arrival. She was admitted for further evaluation and treatment.  SUBJECTIVE Her husband is at the bedside.  Overall she feels her condition is gradually improving.   OBJECTIVE Most recent Vital Signs: Filed Vitals:   04/26/12 0224 04/26/12 0605 04/26/12 0816 04/26/12 1054  BP: 152/73 152/61 143/70 137/77  Pulse: 68 74 72 81  Temp: 97.8 F (36.6 C) 97.9 F (36.6 C) 98.2 F (36.8 C) 97.5 F (36.4 C)  TempSrc: Oral Oral Oral Oral  Resp: 16 15 16 18   Height:      Weight:      SpO2: 100% 99% 97%    CBG (last 3)  No results found for this basename: GLUCAP:3 in the last 72 hours  IV Fluid Intake:     MEDICATIONS    . aspirin  325 mg Oral Daily  . atorvastatin  20 mg Oral Daily  . glimepiride  2 mg Oral QAC breakfast  . heparin  5,000 Units Subcutaneous Q8H  . metFORMIN   500 mg Oral BID WC   PRN:    Diet:  Carb Control thin liquids Activity:  As tolerated DVT Prophylaxis:  Heparin 5000 units sq tid  CLINICALLY SIGNIFICANT STUDIES Basic Metabolic Panel:  Lab 04/25/12 1610  NA 140  K 3.7  CL 102  CO2 26  GLUCOSE 131*  BUN 9  CREATININE 0.60  CALCIUM 9.9  MG --  PHOS --   Liver Function Tests:  Lab 04/25/12 1920  AST 18  ALT 25  ALKPHOS 96  BILITOT 0.3  PROT 7.8  ALBUMIN 4.4   CBC:  Lab 04/25/12 1920  WBC 5.9  NEUTROABS 3.4  HGB 13.7  HCT 39.8  MCV 84.5  PLT 163   Coagulation:  Lab 04/25/12 1920  LABPROT 11.8  INR 0.87   Cardiac Enzymes:  Lab 04/25/12 1920  CKTOTAL --  CKMB --  CKMBINDEX --  TROPONINI <0.30   Urinalysis:  Lab 04/25/12 1940  COLORURINE YELLOW  LABSPEC 1.009  PHURINE 6.0  GLUCOSEU NEGATIVE  HGBUR NEGATIVE  BILIRUBINUR NEGATIVE  KETONESUR NEGATIVE  PROTEINUR NEGATIVE  UROBILINOGEN 0.2  NITRITE NEGATIVE  LEUKOCYTESUR SMALL*   Lipid Panel    Component Value  Date/Time   CHOL 131 04/26/2012 0535   TRIG 86 04/26/2012 0535   HDL 63 04/26/2012 0535   CHOLHDL 2.1 04/26/2012 0535   VLDL 17 04/26/2012 0535   LDLCALC 51 04/26/2012 0535   HgbA1C  No results found for this basename: HGBA1C    Urine Drug Screen:   No results found for this basename: labopia, cocainscrnur, labbenz, amphetmu, thcu, labbarb    Alcohol Level: No results found for this basename: ETH:2 in the last 168 hours  Dg Foot Complete Left  04/25/2012  Negative exam.    Dg Foot Complete Right 04/25/2012  Negative exam.     CT of the brain  04/25/2012   Negative exam.    Ct Cervical Spine Wo Contrast  04/25/2012   No acute finding.  MRI of the brain  04/26/2012   No acute, post-traumatic or likely significant finding.  Mild chronic small vessel change of the hemispheric white matter, less than often seen in healthy individuals of this age.    MRA of the brain  04/26/2012   Normal intracranial MR angiography of the large and medium-sized vessels.    2D Echocardiogram    Carotid Doppler  Right = 40-59% ICA stenosis. Left = <40% ICA stenosis.  Antegrade vertebral flow.  CXR  04/25/2012   No acute finding.  Stable compared to prior exam.     EKG  normal sinus rhythm.   Therapy Recommendations   Physical Exam   Pleasant middle aged lady not in distress.Awake alert. Afebrile. Head is nontraumatic. Neck is supple without bruit. Hearing is normal. Cardiac exam no murmur or gallop. Lungs are clear to auscultation. Distal pulses are well felt.  Neurological Exam ; Awake  Alert oriented x 3. Normal speech and language.eye movements full without nystagmus.fundi were not visualized. Vision acuity and fields appear normal.. Face symmetric. Tongue midline. Normal strength, tone, reflexes and coordination. Normal sensation. Gait deferred.  ASSESSMENT [name] is a 72 y.o. female presenting with vision difficulty and difficulty speaking. Imaging confirms no acute infarct. Dx: Migraine variant.  On no antiplatelets prior to admission. Now on aspirin 325 mg orally every day for secondary stroke prevention. Patient with no resultant neuro deficits. Work up underway.  Hyperlipidemia, LDL 51, on statin PTA, on statin now, goal LDL < 100 Diabetes, HgbA1c pending  Hospital day # 1  TREATMENT/PLAN  Continue aspirin 325 mg orally every day for secondary stroke prevention for now.  F/u 2D echo  Consider migraine prophylaxis in the future, Dr. Pearlean Brownie will address at followup  [facility] for discharge this afternoon once stroke work up completed from neuro standpoint  Follow up Dr. Pearlean Brownie in 2 months  Annie Main, MSN, RN, ANVP-BC, ANP-BC, [facility] Pager: [phone] 04/26/2012 11:24 AM  I have personally obtained a history, examined the patient, evaluated imaging results, and formulated the assessment and plan of care. I agree with the above.   Delia Heady, MD Medical Director [facility] Pager:  [phone] 04/26/2012 5:23 PM

## 2012-04-26 NOTE — Discharge Summary (Signed)
Physician Discharge Summary  Patient ID: Anna Norris MRN: [id number] DOB/AGE: [birth year] 72 y.o.  Admit date: 04/25/2012 Discharge date: 04/26/2012  Primary Care Physician:  No primary provider on file.  Disposition and Follow-up:  PCP in 1 week Dr. Pearlean Brownie in 1 month  Discharge Diagnoses:   1. Migraine variant vs TIA 2. DM2 (diabetes mellitus, type 2) 3. Hyperlipidemia 4.  Right carotid artery 40-59% stenosis      Medication List     As of 04/26/2012  2:31 PM    TAKE these medications         aspirin 325 MG tablet   Take 1 tablet (325 mg total) by mouth daily.      atorvastatin 20 MG tablet   Commonly known as: LIPITOR   Take 20 mg by mouth daily.      glimepiride 2 MG tablet   Commonly known as: AMARYL   Take 2 mg by mouth daily before breakfast.      ASK your doctor about these medications         metFORMIN 500 MG tablet   Commonly known as: GLUCOPHAGE   Take 1,000 mg by mouth 2 (two) times daily with a meal.        Consults: Dr.Kirkpatrick, MD  Significant Diagnostic Studies:   2D ECHO Study Conclusions - Left ventricle: The cavity size was normal. Wall thickness was normal. Systolic function was normal. The estimated ejection fraction was in the range of 60% to 65%. Wall motion was normal; there were no regional wall motion abnormalities. - Atrial septum: No defect or patent foramen ovale was identified. - Pericardium, extracardiac: A small, free-flowing pericardial effusion was identified circumferential to the heart. The fluid had no internal echoes.There was no evidence of hemodynamic compromise.  Carotid duplex: Findings consistent with 40 - 59 percent stenosis involving the right internal carotid artery. - Findings consistent with less than 39 percent stenosis involving the left internal carotid artery.     Dg Chest 2 View  04/25/2012  *RADIOLOGY REPORT*  Clinical Data: Chest pain, weakness and dizziness.  CHEST - 2 VIEW  Comparison: PA  and lateral chest 12/15/2010.  Findings: Lungs are clear.  Heart size is normal.  There is no pneumothorax or pleural effusion.  Degenerative change about the shoulders and in the mid thoracic spine is noted.  IMPRESSION: No acute finding.  Stable compared to prior exam.   Original Report Authenticated By: Holley Dexter, M.D.    Ct Head Wo Contrast  04/25/2012  *RADIOLOGY REPORT*  Clinical Data:  Status post fall 10 days ago with a blow to the left side of the head.  Confusion and visual disturbance.  CT HEAD WITHOUT CONTRAST CT CERVICAL SPINE WITHOUT CONTRAST  Technique:  Multidetector CT imaging of the head and cervical spine was performed following the standard protocol without intravenous contrast.  Multiplanar CT image reconstructions of the cervical spine were also generated.  Comparison:   None  CT HEAD  Findings: The brain appears normal without infarct, hemorrhage, mass lesion, mass effect, midline shift or abnormal extra-axial fluid collection.  There is no hydrocephalus or pneumocephalus. The calvarium is intact.  Imaged paranasal sinuses and mastoid air cells are clear.  IMPRESSION: Negative exam.  CT CERVICAL SPINE  Findings: There is no fracture or subluxation of the cervical spine.  Loss of disc space height appears worst at C5-6 and C6-7. Lung apices are clear.  IMPRESSION: No acute finding.   Original Report Authenticated By: Holley Dexter,  M.D.    Ct Cervical Spine Wo Contrast  04/25/2012  *RADIOLOGY REPORT*  Clinical Data:  Status post fall 10 days ago with a blow to the left side of the head.  Confusion and visual disturbance.  CT HEAD WITHOUT CONTRAST CT CERVICAL SPINE WITHOUT CONTRAST  Technique:  Multidetector CT imaging of the head and cervical spine was performed following the standard protocol without intravenous contrast.  Multiplanar CT image reconstructions of the cervical spine were also generated.  Comparison:   None  CT HEAD  Findings: The brain appears normal without  infarct, hemorrhage, mass lesion, mass effect, midline shift or abnormal extra-axial fluid collection.  There is no hydrocephalus or pneumocephalus. The calvarium is intact.  Imaged paranasal sinuses and mastoid air cells are clear.  IMPRESSION: Negative exam.  CT CERVICAL SPINE  Findings: There is no fracture or subluxation of the cervical spine.  Loss of disc space height appears worst at C5-6 and C6-7. Lung apices are clear.  IMPRESSION: No acute finding.   Original Report Authenticated By: Holley Dexter, M.D.    Mr Brain Wo Contrast  04/26/2012  *RADIOLOGY REPORT*  Clinical Data:  Fall.  Headache.  Difficulty speaking.  Mental status changes.  MRI HEAD WITHOUT CONTRAST MRA HEAD WITHOUT CONTRAST  Technique:  Multiplanar, multiecho pulse sequences of the brain and surrounding structures were obtained without intravenous contrast. Angiographic images of the head were obtained using MRA technique without contrast.  Comparison:  Head CT 04/25/2012  MRI HEAD  Findings:  Diffusion imaging does not show any acute or subacute infarction.  The brainstem and cerebellum are normal.  The cerebral hemispheres show mild chronic small vessel change of the deep white matter, less than often seen in healthy individuals of this age. No cortical or large vessel territory abnormality.  No mass lesion, hemorrhage, hydrocephalus or extra-axial collection.  No pituitary mass.  No inflammatory sinus disease.  No skull or skull base lesion.  IMPRESSION: No acute, post-traumatic or likely significant finding.  Mild chronic small vessel change of the hemispheric white matter, less than often seen in healthy individuals of this age.  MRA HEAD  Findings: Both internal carotid arteries are widely patent into the brain.  The anterior and middle cerebral vessels are patent without proximal stenosis, aneurysm or vascular malformation.  Both vertebral arteries are patent with the left terminating in pica.  The right vertebral artery is a  large vessel patent to the basilar.  No basilar stenosis.  Posterior circulation branch vessels appear normal, with the posterior cerebral arteries receiving most there supply from the anterior circulation.  IMPRESSION: Normal intracranial MR angiography of the large and medium-sized vessels.   Original Report Authenticated By: Paulina Fusi, M.D.    Dg Foot Complete Left  04/25/2012  *RADIOLOGY REPORT*  Clinical Data: Foot pain since a fall 04/14/2012.  LEFT FOOT - COMPLETE 3+ VIEW  Comparison: None.  Findings: No acute bony or joint abnormality is identified.  Soft tissue structures are unremarkable.  IMPRESSION: Negative exam.   Original Report Authenticated By: Holley Dexter, M.D.    Dg Foot Complete Right  04/25/2012  *RADIOLOGY REPORT*  Clinical Data: Foot pain since a fall 04/14/2012.  RIGHT FOOT COMPLETE - 3+ VIEW  Comparison: None.  Findings: No acute bony or joint abnormality is identified.  Soft tissues are unremarkable.  IMPRESSION: Negative exam.   Original Report Authenticated By: Holley Dexter, M.D.    Mr Mra Head/brain Wo Cm  04/26/2012  *RADIOLOGY REPORT*  Clinical Data:  Fall.  Headache.  Difficulty speaking.  Mental status changes.  MRI HEAD WITHOUT CONTRAST MRA HEAD WITHOUT CONTRAST  Technique:  Multiplanar, multiecho pulse sequences of the brain and surrounding structures were obtained without intravenous contrast. Angiographic images of the head were obtained using MRA technique without contrast.  Comparison:  Head CT 04/25/2012  MRI HEAD  Findings:  Diffusion imaging does not show any acute or subacute infarction.  The brainstem and cerebellum are normal.  The cerebral hemispheres show mild chronic small vessel change of the deep white matter, less than often seen in healthy individuals of this age. No cortical or large vessel territory abnormality.  No mass lesion, hemorrhage, hydrocephalus or extra-axial collection.  No pituitary mass.  No inflammatory sinus disease.  No skull or  skull base lesion.  IMPRESSION: No acute, post-traumatic or likely significant finding.  Mild chronic small vessel change of the hemispheric white matter, less than often seen in healthy individuals of this age.  MRA HEAD  Findings: Both internal carotid arteries are widely patent into the brain.  The anterior and middle cerebral vessels are patent without proximal stenosis, aneurysm or vascular malformation.  Both vertebral arteries are patent with the left terminating in pica.  The right vertebral artery is a large vessel patent to the basilar.  No basilar stenosis.  Posterior circulation branch vessels appear normal, with the posterior cerebral arteries receiving most there supply from the anterior circulation.  IMPRESSION: Normal intracranial MR angiography of the large and medium-sized vessels.   Original Report Authenticated By: Paulina Fusi, M.D.     Brief H and P: AMBREE FRANCES is a 72 y.o. female who suffered a mechanical fall on Jan 26 down 1 step injuring her toes and striking her head against a door. She has been having headaches for at least the past 4 days in the morning that have resolved after 2 hours. More concerning however, 2 days ago the patient had difficulty speaking described as talking gibberish, she was aware of this deficit, this resolved after a few mins. Immediately prior to the episode of garbled speech, she notes she had been reading a book, but when ever she would focus on a letter in a word, she could see the letters to the L side of where she was looking but not the R side of where she was looking. Thankfully her symptoms resolved shortly thereafter and she has been back to baseline since.  In the ED CT head was negative, given concern for TIA however patient has been transferred to [facility] for admission.    Hospital Course:  Ms. KASHAWNA MANZER is a 72 y.o. female presenting with vision difficulty and difficulty speaking. Imaging confirms not acute infarct. Dx: Migraine variant.  Vs TIA,  She was not on antiplatelets prior to admission. Now on aspirin 325 mg orally every day for secondary stroke prevention. Patient with no resultant neuro deficits. Work up underway.  Hyperlipidemia, LDL 51, on statin PTA, on statin now, goal LDL < 100  Diabetes, HgbA1c 7.5 ECHO : EF of 60% with normal wall motion Carotid duplex: 40-59% RICA stenosis  TREATMENT/PLAN  Continue aspirin 325 mg orally every day for secondary stroke prevention for now.  Consider migraine prophylaxis in the future, Dr. Pearlean Brownie will address at followup  Advised to follow up Dr. Pearlean Brownie in 1 month   Time spent on Discharge:  Signed: [facility] Pager: [phone] 04/26/2012, 2:31 PM

## 2012-04-26 NOTE — Progress Notes (Signed)
Utilization Review Completed.Anna Norris T2/09/2012   

## 2012-04-26 NOTE — Progress Notes (Signed)
Routine adult EEG completed. 

## 2012-04-26 NOTE — H&P (Signed)
[facility] History and Physical  Anna Norris ZOX:[id number] DOB: 05-Aug-1940 DOA: 04/25/2012  Referring physician: ED PCP: No primary provider on file.  Specialists: None  Chief Complaint: Fall, slurred speech  HPI: Anna Norris is a 72 y.o. female who suffered a mechanical fall on Jan 26 down 1 step injuring her toes and striking her head against a door.  She has been having headaches for at least the past 4 days in the morning that have resolved after 2 hours.  More concerning however, 2 days ago the patient had difficulty speaking described as talking gibberish, she was aware of this deficit, this resolved after a few mins.  Immediately prior to the episode of garbled speech, she notes she had been reading a book, but when ever she would focus on a letter in a word, she could see the letters to the L side of where she was looking but not the R side of where she was looking.  Thankfully her symptoms resolved shortly thereafter and she has been back to baseline since.  In the ED CT head was negative, given concern for TIA however patient has been transferred to [facility] for admission.  Review of Systems: 12 systems reviewed and otherwise negative.  Past Medical History  Diagnosis Date  . Tachycardia     no problems now.  . Asthma   . Diabetes mellitus     A1C < 7   Past Surgical History  Procedure Date  . Uterine cyst   . Tubal ligation   . Hand surgery   . Breast lumpectomy   . Foot surgery    Social History:  reports that she has never smoked. She does not have any smokeless tobacco history on file. She reports that she drinks alcohol. She reports that she does not use illicit drugs.   Allergies  Allergen Reactions  . Shellfish Allergy   . Yellow Dyes (Non-Tartrazine) Hives    Family History  Problem Relation Age of Onset  . Stroke Neg Hx     Prior to Admission medications   Medication Sig Start Date End Date Taking? Authorizing Provider  atorvastatin (LIPITOR) 20  MG tablet Take 20 mg by mouth daily.   Yes Historical Provider, MD  glimepiride (AMARYL) 2 MG tablet Take 2 mg by mouth daily before breakfast.    Historical Provider, MD  metFORMIN (GLUCOPHAGE) 500 MG tablet Take 500 mg by mouth 2 (two) times daily with a meal.    Historical Provider, MD   Physical Exam: Filed Vitals:   04/25/12 1825 04/25/12 2202 04/25/12 2228 04/25/12 2307  BP:  149/69  155/79  Pulse:  82  80  Temp:   98.4 F (36.9 C) 97.5 F (36.4 C)  TempSrc:    Oral  Resp:  16  18  Height: 5\' 6"  (1.676 m)   5\' 6"  (1.676 m)  Weight: 77.111 kg (170 lb)   77.3 kg (170 lb 6.7 oz)  SpO2:  97%  99%    General:  NAD, resting comfortably in bed Eyes: PEERLA EOMI ENT: mucous membranes moist Neck: supple w/o JVD Cardiovascular: RRR w/o MRG Respiratory: CTA B Abdomen: soft, nt, nd, bs+ Skin: no rash nor lesion Musculoskeletal: MAE, full ROM all 4 extremities Psychiatric: normal tone and affect Neurologic: AAOx3, grossly non-focal  Labs on Admission:  Basic Metabolic Panel:  Lab 04/25/12 8119  NA 140  K 3.7  CL 102  CO2 26  GLUCOSE 131*  BUN 9  CREATININE 0.60  CALCIUM 9.9  MG --  PHOS --   Liver Function Tests:  Lab 04/25/12 1920  AST 18  ALT 25  ALKPHOS 96  BILITOT 0.3  PROT 7.8  ALBUMIN 4.4   No results found for this basename: LIPASE:5,AMYLASE:5 in the last 168 hours No results found for this basename: AMMONIA:5 in the last 168 hours CBC:  Lab 04/25/12 1920  WBC 5.9  NEUTROABS 3.4  HGB 13.7  HCT 39.8  MCV 84.5  PLT 163   Cardiac Enzymes:  Lab 04/25/12 1920  CKTOTAL --  CKMB --  CKMBINDEX --  TROPONINI <0.30    BNP (last 3 results) No results found for this basename: PROBNP:3 in the last 8760 hours CBG: No results found for this basename: GLUCAP:5 in the last 168 hours  Radiological Exams on Admission: Dg Chest 2 View  04/25/2012  *RADIOLOGY REPORT*  Clinical Data: Chest pain, weakness and dizziness.  CHEST - 2 VIEW  Comparison: PA and  lateral chest 12/15/2010.  Findings: Lungs are clear.  Heart size is normal.  There is no pneumothorax or pleural effusion.  Degenerative change about the shoulders and in the mid thoracic spine is noted.  IMPRESSION: No acute finding.  Stable compared to prior exam.   Original Report Authenticated By: Holley Dexter, M.D.    Ct Head Wo Contrast  04/25/2012  *RADIOLOGY REPORT*  Clinical Data:  Status post fall 10 days ago with a blow to the left side of the head.  Confusion and visual disturbance.  CT HEAD WITHOUT CONTRAST CT CERVICAL SPINE WITHOUT CONTRAST  Technique:  Multidetector CT imaging of the head and cervical spine was performed following the standard protocol without intravenous contrast.  Multiplanar CT image reconstructions of the cervical spine were also generated.  Comparison:   None  CT HEAD  Findings: The brain appears normal without infarct, hemorrhage, mass lesion, mass effect, midline shift or abnormal extra-axial fluid collection.  There is no hydrocephalus or pneumocephalus. The calvarium is intact.  Imaged paranasal sinuses and mastoid air cells are clear.  IMPRESSION: Negative exam.  CT CERVICAL SPINE  Findings: There is no fracture or subluxation of the cervical spine.  Loss of disc space height appears worst at C5-6 and C6-7. Lung apices are clear.  IMPRESSION: No acute finding.   Original Report Authenticated By: Holley Dexter, M.D.    Ct Cervical Spine Wo Contrast  04/25/2012  *RADIOLOGY REPORT*  Clinical Data:  Status post fall 10 days ago with a blow to the left side of the head.  Confusion and visual disturbance.  CT HEAD WITHOUT CONTRAST CT CERVICAL SPINE WITHOUT CONTRAST  Technique:  Multidetector CT imaging of the head and cervical spine was performed following the standard protocol without intravenous contrast.  Multiplanar CT image reconstructions of the cervical spine were also generated.  Comparison:   None  CT HEAD  Findings: The brain appears normal without infarct,  hemorrhage, mass lesion, mass effect, midline shift or abnormal extra-axial fluid collection.  There is no hydrocephalus or pneumocephalus. The calvarium is intact.  Imaged paranasal sinuses and mastoid air cells are clear.  IMPRESSION: Negative exam.  CT CERVICAL SPINE  Findings: There is no fracture or subluxation of the cervical spine.  Loss of disc space height appears worst at C5-6 and C6-7. Lung apices are clear.  IMPRESSION: No acute finding.   Original Report Authenticated By: Holley Dexter, M.D.    Dg Foot Complete Left  04/25/2012  *RADIOLOGY REPORT*  Clinical Data: Foot pain  since a fall 04/14/2012.  LEFT FOOT - COMPLETE 3+ VIEW  Comparison: None.  Findings: No acute bony or joint abnormality is identified.  Soft tissue structures are unremarkable.  IMPRESSION: Negative exam.   Original Report Authenticated By: Holley Dexter, M.D.    Dg Foot Complete Right  04/25/2012  *RADIOLOGY REPORT*  Clinical Data: Foot pain since a fall 04/14/2012.  RIGHT FOOT COMPLETE - 3+ VIEW  Comparison: None.  Findings: No acute bony or joint abnormality is identified.  Soft tissues are unremarkable.  IMPRESSION: Negative exam.   Original Report Authenticated By: Holley Dexter, M.D.     EKG: Independently reviewed.  Assessment/Plan Principal Problem:  *TIA (transient ischemic attack) Active Problems:  DM2 (diabetes mellitus, type 2)   1. TIA - DDX includes concussion syndrome, obviously concern for a TIA given description of what could be aphasia as well as what sounds a lot like L homonymous hemianopsia.  Thankfully symptoms have resolved and are not present on exam.  On stroke pathway, MRI/MRA brain pending. 2. DM2 - continue home PO hypoglycemics at this time.  Neurology is going to evaluate the patient, have discussed with them.  Code Status: Full code (must indicate code status--if unknown or must be presumed, indicate so) Family Communication: No family in room (indicate person spoken with,  if applicable, with phone number if by telephone) Disposition Plan: Admit to inpatient (indicate anticipated LOS)  Time spent: 70 min  GARDNER, JARED M. [facility] Pager [phone]  If 7PM-7AM, please contact night-coverage [email or web address] Password [email or web address] 04/26/2012, 1:03 AM

## 2012-04-26 NOTE — Progress Notes (Signed)
Pt d/c to home by car with family. Assessment stable prescriptions given. Pt verbalizes understanding of d/c instructions. 

## 2012-08-14 ENCOUNTER — Ambulatory Visit (INDEPENDENT_AMBULATORY_CARE_PROVIDER_SITE_OTHER): Payer: Medicare Other | Admitting: Neurology

## 2012-08-14 ENCOUNTER — Encounter: Payer: Self-pay | Admitting: Neurology

## 2012-08-14 VITALS — BP 160/83 | HR 78 | Temp 97.5°F | Ht 66.0 in | Wt 169.0 lb

## 2012-08-14 DIAGNOSIS — E785 Hyperlipidemia, unspecified: Secondary | ICD-10-CM | POA: Insufficient documentation

## 2012-08-14 DIAGNOSIS — G459 Transient cerebral ischemic attack, unspecified: Secondary | ICD-10-CM

## 2012-08-14 MED ORDER — ASPIRIN 81 MG PO TABS: 81.0000 mg | ORAL_TABLET | Freq: Every day | ORAL | Status: DC

## 2012-08-14 NOTE — Patient Instructions (Signed)
Continue taking aspirin for secondary stroke prevention. Call if migraines become more frequent.  Follow up in office in 3 months.

## 2012-08-14 NOTE — Progress Notes (Signed)
[facility]  PATIENT: Anna Norris DOB: 10-08-1940   HISTORY FROM: patient, chart REASON FOR VISIT: hospital follow up  HISTORY OF PRESENT ILLNESS: Mrs. Anna Norris is a pleasant 72 year old Caucasian female who had an episode of vision disturbance (blind spot) and dysarthria on 04/26/12.  Symptoms quickly resolved.  She was taken to the hospital by family and admitted for stroke workup. Symptoms have not recurred, but patient has had some flashing lights in her right peripheral field one month after the episode which has stopped, and thinks her vision is worse in her right eye since the episode.  No other speech problems.  All imaging was negative for stroke. She was discharged with Dx of TIA vs. Migraine variant and put on Aspirin 325 mg daily.  Patient denies medication side effects, with no signs of bleeding.  She has had one migraine headache since discharge.  REVIEW OF SYSTEMS: Full 14 system review of systems performed and notable only for constitutional: Blurred vision Hematology: easy bruising, easy bleeding respiratory: cough, wheezing, endocrine: increased thirst ear/nose/throat: Hearing loss, ringing in ears musculoskeletal: Joint pain, aching muscles skin: Itching, moles allergy/immunology: Allergies, runny nose, skin sensitivity neurological: headache sleep: Insomnia psychiatric: not enough sleep  ALLERGIES: Allergies  Allergen Reactions  . Shellfish Allergy   . Strawberry Swelling    Strawberry with shrimp made lips swollen  . Yellow Dyes (Non-Tartrazine) Hives    HOME MEDICATIONS: Outpatient Prescriptions Prior to Visit  Medication Sig Dispense Refill  . aspirin 325 MG tablet Take 1 tablet (325 mg total) by mouth daily.      Marland Kitchen atorvastatin (LIPITOR) 20 MG tablet Take 20 mg by mouth daily.      Marland Kitchen glimepiride (AMARYL) 2 MG tablet Take 2 mg by mouth daily before breakfast.      . metFORMIN (GLUCOPHAGE) 500 MG tablet Take 1,000 mg by mouth 2 (two) times  daily with a meal.       No facility-administered medications prior to visit.    PAST MEDICAL HISTORY: Past Medical History  Diagnosis Date  . Tachycardia     no problems now.  . Asthma   . Diabetes mellitus     A1C < 7    PAST SURGICAL HISTORY: Past Surgical History  Procedure Laterality Date  . Uterine cyst    . Tubal ligation    . Hand surgery    . Breast lumpectomy    . Foot surgery      FAMILY HISTORY: Family History  Problem Relation Age of Onset  . Stroke Neg Hx   . Heart failure Father     SOCIAL HISTORY: History   Social History  . Marital Status: Single    Spouse Name: N/A    Number of Children: N/A  . Years of Education: N/A   Occupational History  . Not on file.   Social History Main Topics  . Smoking status: Never Smoker   . Smokeless tobacco: Not on file  . Alcohol Use: Yes     Comment: occassionally  . Drug Use: No  . Sexually Active:    Other Topics Concern  . Not on file   Social History Narrative  . No narrative on file     PHYSICAL EXAM  Filed Vitals:   08/14/12 1325  BP: 160/83  Pulse: 78  Temp: 97.5 F (36.4 C)  TempSrc: Oral  Height: 5\' 6"  (1.676 m)  Weight: 169 lb (76.658 kg)   Body mass index is  27.29 kg/(m^2).  GENERAL EXAM: Patient is in no distress, well developed and well groomed. HEAD: Symmetric facial features. EARS, NOSE, and THROAT: Normal.  NECK: Supple, no JVD RESPIRATORY: Lungs CTA. CARDIOVASCULAR: Regular rate and rhythm, no murmurs, no carotid bruits SKIN: No rash, no bruising  NEUROLOGIC: MENTAL STATUS: awake, alert and oriented to person, place and time, language fluent, comprehension intact, naming intact CRANIAL NERVE: pupils equal and reactive to light, visual fields full to confrontation, extraocular muscles intact, no nystagmus, facial sensation and strength symmetric, uvula midline, shoulder shrug symmetric, tongue midline. MOTOR: normal bulk and tone, full strength in the BUE, BLE, fine  finger movements normal SENSORY: normal and symmetric to light touch, pinprick, temperature, vibration COORDINATION: finger-nose-finger, REFLEXES: deep tendon reflexes present and symmetric 2+, GAIT/STATION: narrow based gait; able to walk on toes, heels and tandem; romberg is negative. No assistive device.   DIAGNOSTIC DATA (LABS, IMAGING, TESTING) - I reviewed patient records, labs, notes, testing and imaging myself where available.  Lab Results  Component Value Date   WBC 5.9 04/25/2012   HGB 13.7 04/25/2012   HCT 39.8 04/25/2012   MCV 84.5 04/25/2012   PLT 163 04/25/2012      Component Value Date/Time   NA 140 04/25/2012 1920   K 3.7 04/25/2012 1920   CL 102 04/25/2012 1920   CO2 26 04/25/2012 1920   GLUCOSE 131* 04/25/2012 1920   BUN 9 04/25/2012 1920   CREATININE 0.60 04/25/2012 1920   CALCIUM 9.9 04/25/2012 1920   PROT 7.8 04/25/2012 1920   ALBUMIN 4.4 04/25/2012 1920   AST 18 04/25/2012 1920   ALT 25 04/25/2012 1920   ALKPHOS 96 04/25/2012 1920   BILITOT 0.3 04/25/2012 1920   GFRNONAA 90* 04/25/2012 1920   GFRAA >90 04/25/2012 1920   Lab Results  Component Value Date   CHOL 131 04/26/2012   HDL 63 04/26/2012   LDLCALC 51 04/26/2012   TRIG 86 04/26/2012   CHOLHDL 2.1 04/26/2012   Lab Results  Component Value Date   HGBA1C 7.5* 04/26/2012   No results found for this basename: VITAMINB12   No results found for this basename: TSH     ASSESSMENT  [name] is a 72 y.o. Female who had an episode of vision difficulty and difficulty speaking.  Imaging confirms no acute infarct. Dx: Migraine variant vs. TIA.   Will change aspirin  to 81 mg orally every day for secondary stroke prevention. Patient with no resultant neuro deficits except c/o blurred vision. Advised patient to arrange appt with her opthalmologist.   TREATMENT/PLAN  Change to aspirin 81 mg orally every day for secondary stroke prevention. Hyperlipidemia, controlled, LDL 51, on statin PTA, on statin now, goal LDL < 100  Diabetes, HgbA1c  7.5, Advised goal is less than 7. Return for follow up in 3 months Order Carotid Doppers at next visit.  Domique Reardon NP-C 08/14/2012, 2:00 PM  [facility] [address] [place], [place] [address] [phone]  I have personally examined this patient, reviewed pertinent data, developed plan of care and discussed with patient and agree with above. Delia Heady, MD

## 2012-11-14 ENCOUNTER — Ambulatory Visit (INDEPENDENT_AMBULATORY_CARE_PROVIDER_SITE_OTHER): Payer: Medicare Other | Admitting: Nurse Practitioner

## 2012-11-14 ENCOUNTER — Encounter: Payer: Self-pay | Admitting: Nurse Practitioner

## 2012-11-14 VITALS — BP 157/79 | HR 76 | Temp 98.1°F | Ht 66.0 in | Wt 173.0 lb

## 2012-11-14 DIAGNOSIS — E785 Hyperlipidemia, unspecified: Secondary | ICD-10-CM

## 2012-11-14 DIAGNOSIS — E119 Type 2 diabetes mellitus without complications: Secondary | ICD-10-CM

## 2012-11-14 DIAGNOSIS — G459 Transient cerebral ischemic attack, unspecified: Secondary | ICD-10-CM

## 2012-11-14 NOTE — Progress Notes (Signed)
[facility]  PATIENT: Anna Norris DOB: Nov 27, 1940   HISTORY FROM: patient, chart REASON FOR VISIT: routine follow up  HISTORY OF PRESENT ILLNESS:  Anna Norris is a pleasant 72 year old Caucasian female who had an episode of vision disturbance (blind spot) and dysarthria on 04/26/12. Symptoms quickly resolved. She was taken to the hospital by family and admitted for stroke workup. Symptoms have not recurred, but patient has had some flashing lights in her right peripheral field one month after the episode which has stopped, and thinks her vision is worse in her right eye since the episode. No other speech problems. All imaging was negative for stroke. She was discharged with Dx of TIA vs. Migraine variant and put on Aspirin 325 mg daily. Patient denies medication side effects, with no signs of bleeding. She has had one migraine headache since discharge.  UPDATE 11/14/12 (LL): Patient returns for follow up since last visit on 08/14/12.  She has had no recurrent neuro symptoms.  Has been well except for nagging dry cough and occasional wheezing.  Patient has never smoked.  Takes baby aspirin daily for stroke prevention.  Patient denies medication side effects, with no signs of bleeding or excessive bruising.  Had eye exam, was normal, only needs reading glasses.  BP slightly elevated today in office, 157/79, states that is not her normal, has not had high BP in the past.  No recurrent headaches since last visit.  REVIEW OF SYSTEMS: Full 14 system review of systems performed and notable only for: constitutional: N/A  cardiovascular: cough, wheezing respiratory: N/A endocrine: N/A  ear/nose/throat: N/A  Hematology/Lymph: easy bleeding, easy bruising musculoskeletal: N/A skin: N/A genitourinary: N/A Gastrointestinal: N/A allergy/immunology: N/A neurological: N/A sleep: N/A psychiatric: N/A   ALLERGIES: Allergies  Allergen Reactions  . Shellfish Allergy   .  Strawberry Swelling    Strawberry with shrimp made lips swollen  . Yellow Dyes (Non-Tartrazine) Hives    HOME MEDICATIONS: Outpatient Prescriptions Prior to Visit  Medication Sig Dispense Refill  . aspirin 81 MG tablet Take 1 tablet (81 mg total) by mouth daily.      Marland Kitchen atorvastatin (LIPITOR) 20 MG tablet Take 20 mg by mouth daily.      Marland Kitchen glimepiride (AMARYL) 2 MG tablet Take 2 mg by mouth daily before breakfast.      . metFORMIN (GLUCOPHAGE) 500 MG tablet Take 1,000 mg by mouth 2 (two) times daily with a meal.       No facility-administered medications prior to visit.    PAST MEDICAL HISTORY: Past Medical History  Diagnosis Date  . Tachycardia     no problems now.  . Asthma   . Diabetes mellitus     A1C < 7    PAST SURGICAL HISTORY: Past Surgical History  Procedure Laterality Date  . Uterine cyst    . Tubal ligation    . Hand surgery    . Breast lumpectomy    . Foot surgery      FAMILY HISTORY: Family History  Problem Relation Age of Onset  . Stroke Neg Hx   . Heart failure Father   . Lymphoma Brother     SOCIAL HISTORY: History   Social History  . Marital Status: Single    Spouse Name: N/A    Number of Children: N/A  . Years of Education: N/A   Occupational History  . Not on file.   Social History Main Topics  . Smoking status: Never Smoker   .  Smokeless tobacco: Not on file  . Alcohol Use: Yes     Comment: occassionally  . Drug Use: No  . Sexual Activity: Not on file   Other Topics Concern  . Not on file   Social History Narrative  . No narrative on file     PHYSICAL EXAM  Filed Vitals:   11/14/12 1433  BP: 157/79  Pulse: 76  Temp: 98.1 F (36.7 C)  Height: 5\' 6"  (1.676 m)  Weight: 173 lb (78.472 kg)   Body mass index is 27.94 kg/(m^2).  GENERAL EXAM: Patient is in no distress, well developed and well groomed.  HEAD: Symmetric facial features.  EARS, NOSE, and THROAT: Normal.  NECK: Supple, no JVD  RESPIRATORY: Lungs CTA.    CARDIOVASCULAR: Regular rate and rhythm, no murmurs, no carotid bruits  SKIN: No rash, no bruising  NEUROLOGIC:  MENTAL STATUS: awake, alert and oriented to person, place and time, language fluent, comprehension intact, naming intact  CRANIAL NERVE: pupils equal and reactive to light, visual fields full to confrontation, extraocular muscles intact, no nystagmus, facial sensation and strength symmetric, uvula midline, shoulder shrug symmetric, tongue midline.  MOTOR: normal bulk and tone, full strength in the BUE, BLE, fine finger movements normal  SENSORY: normal and symmetric to light touch, pinprick, temperature, vibration  COORDINATION: finger-nose-finger, REFLEXES: deep tendon reflexes present and symmetric 2+,  GAIT/STATION: narrow based gait; able to walk on toes, heels and tandem; romberg is negative. No assistive device.    DIAGNOSTIC DATA (LABS, IMAGING, TESTING) - I reviewed patient records, labs, notes, testing and imaging myself where available.  Lab Results  Component Value Date   WBC 5.9 04/25/2012   HGB 13.7 04/25/2012   HCT 39.8 04/25/2012   MCV 84.5 04/25/2012   PLT 163 04/25/2012      Component Value Date/Time   NA 140 04/25/2012 1920   K 3.7 04/25/2012 1920   CL 102 04/25/2012 1920   CO2 26 04/25/2012 1920   GLUCOSE 131* 04/25/2012 1920   BUN 9 04/25/2012 1920   CREATININE 0.60 04/25/2012 1920   CALCIUM 9.9 04/25/2012 1920   PROT 7.8 04/25/2012 1920   ALBUMIN 4.4 04/25/2012 1920   AST 18 04/25/2012 1920   ALT 25 04/25/2012 1920   ALKPHOS 96 04/25/2012 1920   BILITOT 0.3 04/25/2012 1920   GFRNONAA 90* 04/25/2012 1920   GFRAA >90 04/25/2012 1920   Lab Results  Component Value Date   CHOL 131 04/26/2012   HDL 63 04/26/2012   LDLCALC 51 04/26/2012   TRIG 86 04/26/2012   CHOLHDL 2.1 04/26/2012   Lab Results  Component Value Date   HGBA1C 7.5* 04/26/2012   CAROTID DUPLEX US 04/26/12  Findings consistent with 40 - 59 percent stenosis involving the right internal carotid artery.  Findings consistent with  less than 39 percent stenosis involving the left internal carotid artery. Vertebral flow is antegrade.  MRI HEAD WITHOUT CONTRAST 04/26/12 No acute, post-traumatic or likely significant finding. Mild chronic small vessel change of the hemispheric white matter, less  than often seen in healthy individuals of this age.   MRA HEAD WITHOUT CONTRAST 04/26/12 Normal intracranial MR angiography of the large and medium-sized vessels.  ASSESSMENT AND PLAN Anna Norris is a 72 y.o. Female who had an episode of vision difficulty and difficulty speaking. Imaging confirms no acute infarct. Dx: Migraine variant vs. TIA. Will change aspirin to 81 mg orally every day for secondary stroke prevention. Patient with no resultant neuro  deficits.  Continue aspirin 81 mg orally every day  for secondary stroke prevention and maintain strict control of hypertension with blood pressure goal below 130/90, diabetes with hemoglobin A1c goal below 6.5% and lipids with LDL cholesterol goal below 100 mg/dL.  We will repeat carotid dopplers. Followup in 6 months.  Chestine Belknap NP-C 11/14/2012, 2:51 PM  [facility] [address] [place], [place] [address] [phone]

## 2012-11-14 NOTE — Patient Instructions (Addendum)
We will repeat Carotid Doppler Ultrasound.  Continue aspirin 81 mg orally every day  for secondary stroke prevention and maintain strict control of hypertension with blood pressure goal below 130/90, diabetes with hemoglobin A1c goal below 6.5% and lipids with LDL cholesterol goal below 100 mg/dL.   Followup in the future with me in 6 months.

## 2012-11-28 ENCOUNTER — Ambulatory Visit (INDEPENDENT_AMBULATORY_CARE_PROVIDER_SITE_OTHER): Payer: Medicare Other

## 2012-11-28 DIAGNOSIS — E785 Hyperlipidemia, unspecified: Secondary | ICD-10-CM

## 2012-11-28 DIAGNOSIS — I669 Occlusion and stenosis of unspecified cerebral artery: Secondary | ICD-10-CM

## 2012-11-28 DIAGNOSIS — G459 Transient cerebral ischemic attack, unspecified: Secondary | ICD-10-CM

## 2012-11-28 DIAGNOSIS — E119 Type 2 diabetes mellitus without complications: Secondary | ICD-10-CM

## 2012-12-03 ENCOUNTER — Telehealth: Payer: Self-pay | Admitting: Nurse Practitioner

## 2012-12-03 NOTE — Telephone Encounter (Signed)
Called patient, no answer, left message of recent carotid doppler study; no significant change since last study.  Call with questions.

## 2013-05-08 ENCOUNTER — Encounter (HOSPITAL_COMMUNITY): Payer: Self-pay | Admitting: Emergency Medicine

## 2013-05-08 ENCOUNTER — Emergency Department (HOSPITAL_COMMUNITY): Payer: Medicare HMO

## 2013-05-08 ENCOUNTER — Observation Stay (HOSPITAL_COMMUNITY)
Admission: EM | Admit: 2013-05-08 | Discharge: 2013-05-10 | Disposition: A | Discharge status: Home / Self Care | Payer: Medicare HMO | Attending: Internal Medicine | Admitting: Internal Medicine

## 2013-05-08 DIAGNOSIS — H539 Unspecified visual disturbance: Secondary | ICD-10-CM | POA: Diagnosis not present

## 2013-05-08 DIAGNOSIS — Z8679 Personal history of other diseases of the circulatory system: Secondary | ICD-10-CM | POA: Diagnosis not present

## 2013-05-08 DIAGNOSIS — J45909 Unspecified asthma, uncomplicated: Secondary | ICD-10-CM | POA: Insufficient documentation

## 2013-05-08 DIAGNOSIS — Z8673 Personal history of transient ischemic attack (TIA), and cerebral infarction without residual deficits: Secondary | ICD-10-CM | POA: Diagnosis not present

## 2013-05-08 DIAGNOSIS — Z79899 Other long term (current) drug therapy: Secondary | ICD-10-CM | POA: Insufficient documentation

## 2013-05-08 DIAGNOSIS — Z9109 Other allergy status, other than to drugs and biological substances: Secondary | ICD-10-CM | POA: Diagnosis not present

## 2013-05-08 DIAGNOSIS — R519 Headache, unspecified: Secondary | ICD-10-CM | POA: Diagnosis present

## 2013-05-08 DIAGNOSIS — Z91018 Allergy to other foods: Secondary | ICD-10-CM | POA: Diagnosis not present

## 2013-05-08 DIAGNOSIS — Z7982 Long term (current) use of aspirin: Secondary | ICD-10-CM | POA: Diagnosis not present

## 2013-05-08 DIAGNOSIS — R51 Headache: Principal | ICD-10-CM | POA: Insufficient documentation

## 2013-05-08 DIAGNOSIS — E119 Type 2 diabetes mellitus without complications: Secondary | ICD-10-CM | POA: Diagnosis not present

## 2013-05-08 DIAGNOSIS — Z91013 Allergy to seafood: Secondary | ICD-10-CM | POA: Diagnosis not present

## 2013-05-08 DIAGNOSIS — R4182 Altered mental status, unspecified: Secondary | ICD-10-CM

## 2013-05-08 DIAGNOSIS — G459 Transient cerebral ischemic attack, unspecified: Secondary | ICD-10-CM | POA: Diagnosis present

## 2013-05-08 DIAGNOSIS — R4701 Aphasia: Secondary | ICD-10-CM | POA: Diagnosis not present

## 2013-05-08 HISTORY — DX: Other complications of anesthesia, initial encounter: T88.59XA

## 2013-05-08 HISTORY — DX: Adverse effect of unspecified anesthetic, initial encounter: T41.45XA

## 2013-05-08 LAB — URINALYSIS, ROUTINE W REFLEX MICROSCOPIC
Bilirubin Urine: NEGATIVE
Glucose, UA: NEGATIVE mg/dL
HGB URINE DIPSTICK: NEGATIVE
Ketones, ur: NEGATIVE mg/dL
NITRITE: NEGATIVE
PROTEIN: NEGATIVE mg/dL
SPECIFIC GRAVITY, URINE: 1.009 (ref 1.005–1.030)
UROBILINOGEN UA: 0.2 mg/dL (ref 0.0–1.0)
pH: 6.5 (ref 5.0–8.0)

## 2013-05-08 LAB — RAPID URINE DRUG SCREEN, HOSP PERFORMED
AMPHETAMINES: NOT DETECTED
BARBITURATES: NOT DETECTED
Benzodiazepines: NOT DETECTED
Cocaine: NOT DETECTED
OPIATES: NOT DETECTED
TETRAHYDROCANNABINOL: NOT DETECTED

## 2013-05-08 LAB — APTT: APTT: 28 s (ref 24–37)

## 2013-05-08 LAB — CBC
HCT: 38.6 % (ref 36.0–46.0)
Hemoglobin: 13.3 g/dL (ref 12.0–15.0)
MCH: 29.4 pg (ref 26.0–34.0)
MCHC: 34.5 g/dL (ref 30.0–36.0)
MCV: 85.2 fL (ref 78.0–100.0)
PLATELETS: 156 10*3/uL (ref 150–400)
RBC: 4.53 MIL/uL (ref 3.87–5.11)
RDW: 13.2 % (ref 11.5–15.5)
WBC: 5.4 10*3/uL (ref 4.0–10.5)

## 2013-05-08 LAB — COMPREHENSIVE METABOLIC PANEL
ALK PHOS: 83 U/L (ref 39–117)
ALT: 19 U/L (ref 0–35)
AST: 19 U/L (ref 0–37)
Albumin: 4.1 g/dL (ref 3.5–5.2)
BILIRUBIN TOTAL: 0.2 mg/dL — AB (ref 0.3–1.2)
BUN: 12 mg/dL (ref 6–23)
CHLORIDE: 101 meq/L (ref 96–112)
CO2: 27 mEq/L (ref 19–32)
Calcium: 9.6 mg/dL (ref 8.4–10.5)
Creatinine, Ser: 0.62 mg/dL (ref 0.50–1.10)
GFR calc Af Amer: >90 mL/min (ref >90)
GFR calc non Af Amer: 88 mL/min — ABNORMAL LOW (ref >90)
Glucose, Bld: 98 mg/dL (ref 70–99)
Potassium: 3.9 mEq/L (ref 3.7–5.3)
SODIUM: 143 meq/L (ref 137–147)
TOTAL PROTEIN: 7.3 g/dL (ref 6.0–8.3)

## 2013-05-08 LAB — I-STAT CHEM 8, ED
BUN: 10 mg/dL (ref 6–23)
CHLORIDE: 101 meq/L (ref 96–112)
Calcium, Ion: 1.25 mmol/L (ref 1.13–1.30)
Creatinine, Ser: 0.7 mg/dL (ref 0.50–1.10)
GLUCOSE: 99 mg/dL (ref 70–99)
HEMATOCRIT: 41 % (ref 36.0–46.0)
Hemoglobin: 13.9 g/dL (ref 12.0–15.0)
POTASSIUM: 3.8 meq/L (ref 3.7–5.3)
Sodium: 141 mEq/L (ref 137–147)
TCO2: 26 mmol/L (ref 0–100)

## 2013-05-08 LAB — PROTIME-INR
INR: 0.92 (ref 0.00–1.49)
Prothrombin Time: 12.2 seconds (ref 11.6–15.2)

## 2013-05-08 LAB — DIFFERENTIAL
BASOS ABS: 0 10*3/uL (ref 0.0–0.1)
BASOS PCT: 0 % (ref 0–1)
EOS ABS: 0.1 10*3/uL (ref 0.0–0.7)
Eosinophils Relative: 2 % (ref 0–5)
Lymphocytes Relative: 37 % (ref 12–46)
Lymphs Abs: 2 10*3/uL (ref 0.7–4.0)
MONOS PCT: 5 % (ref 3–12)
Monocytes Absolute: 0.3 10*3/uL (ref 0.1–1.0)
NEUTROS PCT: 55 % (ref 43–77)
Neutro Abs: 3 10*3/uL (ref 1.7–7.7)

## 2013-05-08 LAB — I-STAT TROPONIN, ED: TROPONIN I, POC: 0 ng/mL (ref 0.00–0.08)

## 2013-05-08 LAB — URINE MICROSCOPIC-ADD ON

## 2013-05-08 LAB — ETHANOL

## 2013-05-08 MED ORDER — DIPHENHYDRAMINE HCL 50 MG/ML IJ SOLN
25.0000 mg | Freq: Once | INTRAMUSCULAR | Status: AC
  Administered 2013-05-08: 25 mg via INTRAVENOUS
  Filled 2013-05-08: qty 1

## 2013-05-08 MED ORDER — DEXAMETHASONE SODIUM PHOSPHATE 10 MG/ML IJ SOLN
10.0000 mg | Freq: Once | INTRAMUSCULAR | Status: AC
  Administered 2013-05-08: 10 mg via INTRAVENOUS
  Filled 2013-05-08: qty 1

## 2013-05-08 MED ORDER — METOCLOPRAMIDE HCL 5 MG/ML IJ SOLN
10.0000 mg | Freq: Once | INTRAMUSCULAR | Status: AC
  Administered 2013-05-08: 10 mg via INTRAVENOUS
  Filled 2013-05-08: qty 2

## 2013-05-08 NOTE — ED Notes (Addendum)
Pt in c/o left sided headache that started around 6pm with right sided vision changes, states she had "blank spots" in her vision", as in she was reading a paper and she would see parts of the paper and not see other parts, also experienced expressive aphasia when she attempted to tell her husband what she was experiencing, states she wanted to tell him she had a migraine but was but wasn't able to remember the word or get the word out. These symptoms have resolved at this time, continues to c/o headache at this time. MD evaluation upon arrival to r/o code stroke, no activation at this time.  Pt hypertensive upon EMS arrival at 204/98, improved during transport, CBG 89. Pt with history of similar episode of year ago.

## 2013-05-08 NOTE — ED Provider Notes (Signed)
Patient seen/examined in the Emergency Department in conjunction with Resident Physician Provider Justin Mend Patient reports headache, visual disturbance and difficulty speaking earlier tonight that has since resolved Exam : awake/alert  tPA in stroke considered but not given due to:  Symptoms resolving at this time.  Consider TIA Pt awake/alert, no distress, currently stable.  Continue with workup at this time   Sharyon Cable, MD 05/08/13 1929

## 2013-05-08 NOTE — ED Provider Notes (Signed)
CSN: [id number]     Arrival date & time 05/08/13  1921 History   First MD Initiated Contact with Patient 05/08/13 1923     Chief Complaint  Patient presents with  . Headache   HPI Comments: 73 yo F hx of DMII, asthma, TIA vs Migraine variant (2014) presents with CC of headache, difficulty speaking.  Pt states symptoms started around 6 PM.  She states that she had gradual onset left temporal headache, blurry vision of right eye, and difficulty with word finding.  Headache has continued on arrival, however her other symptoms lasted only about 45 minutes.  She denies fevers, chills, CP, SOB, abdominal pain, nausea, vomiting, diarrhea, myalgias, rash, paresthesias, extremity weakness, or difficulty ambulating.  No medications taken specifically for symptoms today.  She takes her prescription medications as prescribed, with no recent changes.  Pt states symptoms similar to episode 1 year ago, however at that time she was not having headache.  She was diagnosed with TIA vs migraine variant at that time.  Workup at that time also revealed 60% stenosis of R ICA, and 40% stenosis of L ICA.  No other complaints at this time.  The history is provided by the patient. No language interpreter was used.    Past Medical History  Diagnosis Date  . Tachycardia     no problems now.  . Asthma   . Diabetes mellitus     A1C < 7   Past Surgical History  Procedure Laterality Date  . Uterine cyst    . Tubal ligation    . Hand surgery    . Breast lumpectomy    . Foot surgery     Family History  Problem Relation Age of Onset  . Stroke Neg Hx   . Heart failure Father   . Lymphoma Brother    History  Substance Use Topics  . Smoking status: Never Smoker   . Smokeless tobacco: Not on file  . Alcohol Use: Yes     Comment: occassionally   OB History   Grav Para Term Preterm Abortions TAB SAB Ect Mult Living                 Review of Systems  Unable to perform ROS Constitutional: Negative for fever,  chills and diaphoresis.  Eyes: Positive for visual disturbance. Negative for photophobia and pain.  Respiratory: Negative for cough and shortness of breath.   Cardiovascular: Negative for chest pain, palpitations and leg swelling.  Gastrointestinal: Negative for nausea, vomiting, abdominal pain, diarrhea and constipation.  Musculoskeletal: Negative for myalgias.  Skin: Negative for rash.  Neurological: Positive for speech difficulty and headaches. Negative for dizziness, weakness, light-headedness and numbness.  Hematological: Negative for adenopathy. Does not bruise/bleed easily.  All other systems reviewed and are negative.      Allergies  Other; Shellfish allergy; Strawberry; and Yellow dyes (non-tartrazine)  Home Medications   Current Outpatient Rx  Name  Route  Sig  Dispense  Refill  . acetaminophen (TYLENOL) 500 MG tablet   Oral   Take 500 mg by mouth daily as needed for headache.         Marland Kitchen aspirin 81 MG tablet   Oral   Take 1 tablet (81 mg total) by mouth daily.         Marland Kitchen atorvastatin (LIPITOR) 20 MG tablet   Oral   Take 20 mg by mouth daily.         Marland Kitchen glimepiride (AMARYL) 2 MG tablet  Oral   Take 2 mg by mouth daily before breakfast.         . metFORMIN (GLUCOPHAGE) 500 MG tablet   Oral   Take 1,000 mg by mouth 2 (two) times daily with a meal.         . PROAIR HFA 108 (90 BASE) MCG/ACT inhaler   Inhalation   Inhale 2 puffs into the lungs every 4 (four) hours as needed.           BP 159/71  Pulse 81  Temp(Src) 97.3 F (36.3 C) (Oral)  Resp 15  SpO2 100% Physical Exam  Nursing note and vitals reviewed. Constitutional: She is oriented to person, place, and time. She appears well-developed and well-nourished.  HENT:  Head: Normocephalic and atraumatic.  Right Ear: External ear normal.  Left Ear: External ear normal.  Mouth/Throat: Oropharynx is clear and moist.  No TTP of left temporal region or scalp. No palpable cord present.  Eyes:  Conjunctivae are normal. Pupils are equal, round, and reactive to light.  Neck: Normal range of motion. Neck supple.  Cardiovascular: Normal rate, regular rhythm, normal heart sounds and intact distal pulses.   Pulmonary/Chest: Effort normal and breath sounds normal. No respiratory distress. She has no wheezes. She has no rales. She exhibits no tenderness.  Abdominal: Soft. Bowel sounds are normal. She exhibits no distension and no mass. There is no tenderness. There is no rebound and no guarding.  Musculoskeletal: Normal range of motion.  Neurological: She is alert and oriented to person, place, and time.  No facial droop.  No dysarthria.  No visual deficits bilaterally.  Pt deaf in L ear, but normal hearing on R.  No extremity weakness, or sensory deficit.  Tongue not deviated.  Equal shoulder shrug bilaterally.  No dysmetria.  Skin: Skin is warm and dry.    ED Course  Procedures (including critical care time) Labs Review Labs Reviewed  COMPREHENSIVE METABOLIC PANEL - Abnormal; Notable for the following:    Total Bilirubin 0.2 (*)    GFR calc non Af Amer 88 (*)    All other components within normal limits  ETHANOL  PROTIME-INR  APTT  CBC  DIFFERENTIAL  URINE RAPID DRUG SCREEN (HOSP PERFORMED)  URINALYSIS, ROUTINE W REFLEX MICROSCOPIC  I-STAT CHEM 8, ED  I-STAT TROPOININ, ED  I-STAT TROPOININ, ED   Imaging Review Ct Head Wo Contrast  05/08/2013   CLINICAL DATA:  Left temporal headache.  Visual disturbance.  EXAM: CT HEAD WITHOUT CONTRAST  TECHNIQUE: Contiguous axial images were obtained from the base of the skull through the vertex without intravenous contrast.  COMPARISON:  MR HEAD W/O CM dated 04/26/2012; MRA HEAD W/O CM dated 04/26/2012; CT HEAD W/O CM dated 04/25/2012  FINDINGS: Ventricular system normal in size and appearance for age. No mass lesion. No midline shift. No acute hemorrhage or hematoma. No extra-axial fluid collections. No evidence of acute infarction. No significant  interval change.  No focal osseous abnormality involving the skull. Visualized paranasal sinuses, bilateral mastoid air cells, and bilateral middle ear cavities well-aerated. Bilateral carotid siphon and right vertebral artery atherosclerosis.  IMPRESSION: 1. No acute intracranial abnormality. Bilateral carotid siphon atherosclerosis. 2. Stable examination.   Electronically Signed   By: Evangeline Dakin M.D.   On: 05/08/2013 20:25    EKG Interpretation    Date/Time:  Thursday May 08 2013 19:38:51 EST Ventricular Rate:  82 PR Interval:  171 QRS Duration: 94 QT Interval:  400 QTC Calculation: 467 R Axis:  17 Text Interpretation:  Sinus rhythm Non-specific ST-t changes No significant change since last tracing Confirmed by Christy Gentles  MD, [place] [phone]) on 05/08/2013 8:18:44 PM            MDM   Final diagnoses:  None   73 yo F hx of DMII, asthma, TIA vs Migraine variant (2014) presents with CC of headache, difficulty speaking.   Filed Vitals:   05/08/13 2100  BP: 159/71  Pulse: 81  Temp:   Resp: 15   Physical exam as above.  Pt with c/o vision change, difficulty speaking, headache.  Pt only has HA currently.  No focal deficits on exam.  Code stroke considered, but not called 2/2 resolution of symptoms.  EKG as above, NSR, nonspecific ST changes.  CT ordered, which showed no acute abnormalities.  Labs are all WNL.    Pt given headache cocktail, with mild improvement in HA.  Diagnosis TIA vs atypical migraine.  Pt to be admitted to medicine service for r/o TIA.  Pt understands and agrees with plan.  Pt's care plan discussed with Dr. Christy Gentles.  Sinda Du, MD       Sinda Du, MD 05/09/13 0111

## 2013-05-08 NOTE — H&P (Signed)
[facility] History and Physical  Patient: Anna Norris  [id number]  DOB: 22-Mar-1940  DOS: the patient was seen and examined on 05/08/2013 PCP: Horton Finer, MD  Chief Complaint: Aphasia  HPI: Anna Norris is a 73 y.o. female with Past medical history of migraine, diabetes, asthma. The patient is coming from home. Around 6 PM the patient started having headache on the left side, along with that she started having difficulty focusing on her central field of vision. She also started having difficulty bringing her words out. The symptoms were also present during her last admission. She came to the ER and her symptoms were resolved but she continues to have the headache on the left side. At the time of my evaluation Pt denies any fever, chills, headache, cough, chest pain, palpitation, shortness of breath, orthopnea, PND, nausea, vomiting, abdominal pain, diarrhea, constipation, active bleeding, burning urination, dizziness, pedal edema,  focal neurological deficit.  She mentions she is compliant with medications but 2 days ago she had an epistaxis and since then she's not taking her aspirin.  Review of Systems: as mentioned in the history of present illness.  A Comprehensive review of the other systems is negative.  Past Medical History  Diagnosis Date  . Tachycardia     no problems now.  . Asthma   . Diabetes mellitus     A1C < 7   Past Surgical History  Procedure Laterality Date  . Uterine cyst    . Tubal ligation    . Hand surgery    . Breast lumpectomy    . Foot surgery     Social History:  reports that she has never smoked. She does not have any smokeless tobacco history on file. She reports that she drinks alcohol. She reports that she does not use illicit drugs. Independent for most of her  ADL.  Allergies  Allergen Reactions  . Other Other (See Comments)    Mold - headaches   . Shellfish Allergy Swelling  . Strawberry Swelling    Strawberry with  shrimp made lips swollen  . Yellow Dyes (Non-Tartrazine) Hives    Family History  Problem Relation Age of Onset  . Stroke Neg Hx   . Heart failure Father   . Lymphoma Brother     Prior to Admission medications   Medication Sig Start Date End Date Taking? Authorizing Provider  acetaminophen (TYLENOL) 500 MG tablet Take 500 mg by mouth daily as needed for headache.   Yes Historical Provider, MD  aspirin 81 MG tablet Take 1 tablet (81 mg total) by mouth daily. 08/14/12  Yes [name], NP  atorvastatin (LIPITOR) 20 MG tablet Take 20 mg by mouth daily.   Yes Historical Provider, MD  glimepiride (AMARYL) 2 MG tablet Take 2 mg by mouth daily before breakfast.   Yes Historical Provider, MD  metFORMIN (GLUCOPHAGE) 500 MG tablet Take 1,000 mg by mouth 2 (two) times daily with a meal.   Yes Historical Provider, MD  PROAIR HFA 108 (90 BASE) MCG/ACT inhaler Inhale 2 puffs into the lungs every 4 (four) hours as needed.  10/07/12   Historical Provider, MD    Physical Exam: Filed Vitals:   05/08/13 2200 05/08/13 2215 05/08/13 2230 05/08/13 2245  BP: 157/72 153/75 149/61 155/66  Pulse: 78 92 80 86  Temp:      TempSrc:      Resp: 18 14 18 15   SpO2: 99% 98% 100% 98%    General: Alert, Awake  and Oriented to Time, Place and Person. Appear in mild distress Eyes: PERRL ENT: Oral Mucosa clear moist Neck: No  JVD Cardiovascular: S1 and S2 Present, aortic systolic  Murmur, Peripheral Pulses Present Respiratory: Bilateral Air entry equal and Decreased, Clear to Auscultation,  No  Crackles,no  wheezes Abdomen: Bowel Sound Present, Soft and Non tender Skin: No  Rash Extremities: No  Pedal edema, no calf tenderness Neurologic: Mental statusand oriented, follows command, Cranial Nerves this are reactive, extraocular muscle movement intact, peripheral vision intact, Motor strength bilaterally equal strength, Sensation presented to light touch bilaterally, reflexes present, babinski negative, Cerebellar test  normal finger-nose-finger.  Labs on Admission:  CBC:  Recent Labs Lab 05/08/13 1928 05/08/13 1956  WBC 5.4  --   NEUTROABS 3.0  --   HGB 13.3 13.9  HCT 38.6 41.0  MCV 85.2  --   PLT 156  --     CMP     Component Value Date/Time   NA 141 05/08/2013 1956   K 3.8 05/08/2013 1956   CL 101 05/08/2013 1956   CO2 27 05/08/2013 1928   GLUCOSE 99 05/08/2013 1956   BUN 10 05/08/2013 1956   CREATININE 0.70 05/08/2013 1956   CALCIUM 9.6 05/08/2013 1928   PROT 7.3 05/08/2013 1928   ALBUMIN 4.1 05/08/2013 1928   AST 19 05/08/2013 1928   ALT 19 05/08/2013 1928   ALKPHOS 83 05/08/2013 1928   BILITOT 0.2* 05/08/2013 1928   GFRNONAA 88* 05/08/2013 1928   GFRAA >90 05/08/2013 1928    No results found for this basename: LIPASE, AMYLASE,  in the last 168 hours No results found for this basename: AMMONIA,  in the last 168 hours  No results found for this basename: CKTOTAL, CKMB, CKMBINDEX, TROPONINI,  in the last 168 hours BNP (last 3 results) No results found for this basename: PROBNP,  in the last 8760 hours  Radiological Exams on Admission: Ct Head Wo Contrast  05/08/2013   CLINICAL DATA:  Left temporal headache.  Visual disturbance.  EXAM: CT HEAD WITHOUT CONTRAST  TECHNIQUE: Contiguous axial images were obtained from the base of the skull through the vertex without intravenous contrast.  COMPARISON:  MR HEAD W/O CM dated 04/26/2012; MRA HEAD W/O CM dated 04/26/2012; CT HEAD W/O CM dated 04/25/2012  FINDINGS: Ventricular system normal in size and appearance for age. No mass lesion. No midline shift. No acute hemorrhage or hematoma. No extra-axial fluid collections. No evidence of acute infarction. No significant interval change.  No focal osseous abnormality involving the skull. Visualized paranasal sinuses, bilateral mastoid air cells, and bilateral middle ear cavities well-aerated. Bilateral carotid siphon and right vertebral artery atherosclerosis.  IMPRESSION: 1. No acute intracranial abnormality.  Bilateral carotid siphon atherosclerosis. 2. Stable examination.   Electronically Signed   By: Evangeline Dakin M.D.   On: 05/08/2013 20:25    EKG: Independently reviewed. normal EKG, normal sinus rhythm, unchanged from previous tracings.  Assessment/Plan Principal Problem:   Aphasia Active Problems:   TIA (transient ischemic attack)   DM2 (diabetes mellitus, type 2)   Headache   1. Aphasia the patient is presenting with expressive aphasia and loss of central vision which resolved on its own. Initial CT scan is negative and currently she does not have any neurological deficit. At his last admission she was diagnosed with migraine related versus TIA She will be admitted to the hospital, neurology will be consulted by the ED, would continue her on aspirin and Lipitor, obtain MRI brain and  if that shows any significant abnormality she may require further workup. Telemetry monitoring, serial neuro checks PTOT consult   2.Diabetes mellitus  Sliding scale   3.Migraine  Patient has history of migraine at present she has less than one or 2 episodes in a year. Tylenol when necessary .  Consults: Neurology  DVT Prophylaxis: subcutaneous Heparin Nutrition: N.p.o. and advance as tolerated  Code Status: Full  Family Communication: Family  was present at bedside, opportunity was given to ask question and all questions were answered satisfactorily at the time of interview. Disposition: Admitted to observation in telemetry unit.  Author: Berle Mull, MD [facility] Pager: [phone] 05/08/2013, 11:18 PM    If 7PM-7AM, please contact night-coverage [email or web address] Password [email or web address]

## 2013-05-09 ENCOUNTER — Encounter (HOSPITAL_COMMUNITY): Payer: Self-pay | Admitting: General Practice

## 2013-05-09 ENCOUNTER — Observation Stay (HOSPITAL_COMMUNITY): Payer: Medicare HMO

## 2013-05-09 DIAGNOSIS — G459 Transient cerebral ischemic attack, unspecified: Secondary | ICD-10-CM

## 2013-05-09 DIAGNOSIS — R4701 Aphasia: Secondary | ICD-10-CM

## 2013-05-09 DIAGNOSIS — E119 Type 2 diabetes mellitus without complications: Secondary | ICD-10-CM

## 2013-05-09 DIAGNOSIS — I519 Heart disease, unspecified: Secondary | ICD-10-CM

## 2013-05-09 DIAGNOSIS — R51 Headache: Secondary | ICD-10-CM

## 2013-05-09 LAB — LIPID PANEL
CHOL/HDL RATIO: 1.8 ratio
CHOLESTEROL: 146 mg/dL (ref 0–200)
HDL: 79 mg/dL (ref >39)
LDL Cholesterol: 59 mg/dL (ref 0–99)
TRIGLYCERIDES: 40 mg/dL (ref <150)
VLDL: 8 mg/dL (ref 0–40)

## 2013-05-09 LAB — GLUCOSE, CAPILLARY
GLUCOSE-CAPILLARY: 218 mg/dL — AB (ref 70–99)
Glucose-Capillary: 294 mg/dL — ABNORMAL HIGH (ref 70–99)
Glucose-Capillary: 283 mg/dL — ABNORMAL HIGH (ref 70–99)
Glucose-Capillary: 214 mg/dL — ABNORMAL HIGH (ref 70–99)
Glucose-Capillary: 198 mg/dL — ABNORMAL HIGH (ref 70–99)

## 2013-05-09 LAB — HEMOGLOBIN A1C
Hgb A1c MFr Bld: 7.4 % — ABNORMAL HIGH (ref <5.7)
MEAN PLASMA GLUCOSE: 166 mg/dL — AB (ref <117)

## 2013-05-09 MED ORDER — ACETAMINOPHEN 500 MG PO TABS
500.0000 mg | ORAL_TABLET | Freq: Every day | ORAL | Status: DC | PRN
  Administered 2013-05-09 (×2): 500 mg via ORAL
  Filled 2013-05-09 (×2): qty 1

## 2013-05-09 MED ORDER — LORAZEPAM 2 MG/ML IJ SOLN
2.0000 mg | Freq: Once | INTRAMUSCULAR | Status: AC
  Administered 2013-05-09: 2 mg via INTRAVENOUS
  Filled 2013-05-09: qty 1

## 2013-05-09 MED ORDER — ALBUTEROL SULFATE (2.5 MG/3ML) 0.083% IN NEBU: 2.5000 mg | INHALATION_SOLUTION | RESPIRATORY_TRACT | Status: DC | PRN

## 2013-05-09 MED ORDER — GLIMEPIRIDE 2 MG PO TABS
2.0000 mg | ORAL_TABLET | Freq: Every day | ORAL | Status: DC
  Administered 2013-05-09 – 2013-05-10 (×2): 2 mg via ORAL
  Filled 2013-05-09 (×4): qty 1

## 2013-05-09 MED ORDER — STROKE: EARLY STAGES OF RECOVERY BOOK
Freq: Once | Status: AC
  Administered 2013-05-10: 05:00:00
  Filled 2013-05-09: qty 1

## 2013-05-09 MED ORDER — METFORMIN HCL 500 MG PO TABS
1000.0000 mg | ORAL_TABLET | Freq: Two times a day (BID) | ORAL | Status: DC
  Administered 2013-05-09 – 2013-05-10 (×3): 1000 mg via ORAL
  Filled 2013-05-09 (×5): qty 2

## 2013-05-09 MED ORDER — ASPIRIN 325 MG PO TABS
325.0000 mg | ORAL_TABLET | Freq: Every day | ORAL | Status: DC
  Administered 2013-05-09 – 2013-05-10 (×2): 325 mg via ORAL
  Filled 2013-05-09 (×2): qty 1

## 2013-05-09 MED ORDER — INSULIN ASPART 100 UNIT/ML ~~LOC~~ SOLN
0.0000 [IU] | Freq: Three times a day (TID) | SUBCUTANEOUS | Status: DC
  Administered 2013-05-10: 2 [IU] via SUBCUTANEOUS

## 2013-05-09 MED ORDER — HEPARIN SODIUM (PORCINE) 5000 UNIT/ML IJ SOLN
5000.0000 [IU] | Freq: Three times a day (TID) | INTRAMUSCULAR | Status: DC
  Administered 2013-05-09: 5000 [IU] via SUBCUTANEOUS
  Filled 2013-05-09 (×6): qty 1

## 2013-05-09 MED ORDER — ATORVASTATIN CALCIUM 20 MG PO TABS
20.0000 mg | ORAL_TABLET | Freq: Every day | ORAL | Status: DC
  Administered 2013-05-09 – 2013-05-10 (×2): 20 mg via ORAL
  Filled 2013-05-09 (×2): qty 1

## 2013-05-09 MED ORDER — INSULIN ASPART 100 UNIT/ML ~~LOC~~ SOLN
0.0000 [IU] | Freq: Every day | SUBCUTANEOUS | Status: DC
  Administered 2013-05-09: 3 [IU] via SUBCUTANEOUS

## 2013-05-09 NOTE — ED Provider Notes (Signed)
Addendum to resident note, pt fully awake/alert and able to participate in review of systems  Sharyon Cable, MD 05/09/13 1009

## 2013-05-09 NOTE — Progress Notes (Signed)
PT Cancellation Note  Patient Details Name: MARCY SOOKDEO MRN: [id number] DOB: 12-13-1940   Cancelled Treatment:    Reason Eval/Treat Not Completed: Patient at procedure or test/unavailable.  PT to check back later as time allows.     Thanks,    Barbarann Ehlers. [place], [place], DPT [phone]   05/09/2013, 11:26 AM

## 2013-05-09 NOTE — Progress Notes (Signed)
Echo Lab  2D Echocardiogram completed.  Theoplis Garciagarcia L Kayven Aldaco, RDCS 05/09/2013 11:17 AM

## 2013-05-09 NOTE — Progress Notes (Signed)
Subjective: Admissions notes reviewed from emergency room MD, admitting M.D., and neurology. Patient feeling well currently. Neuro workup ongoing. Patient ambulatory in the room and speaking well and able to find words  Objective: Weight change:  No intake or output data in the 24 hours ending 05/09/13 1303 Filed Vitals:   05/09/13 0600 05/09/13 0700 05/09/13 1000 05/09/13 1200  BP: 147/74 143/71 134/53 132/76  Pulse: 83 81 94 96  Temp: 97.8 F (36.6 C) 98.1 F (36.7 C) 97.9 F (36.6 C) 97.8 F (36.6 C)  TempSrc: Oral Oral Oral Oral  Resp: _0 Height:      Weight:      SpO2: 95% 98% 98% 97%    General Appearance: Alert, cooperative, no distress, appears stated age, speaking normally Lungs: Clear to auscultation bilaterally, respirations unlabored Heart: Regular rate and rhythm, S1 and S2 normal, no murmur, rub or gallop Abdomen: Soft, non-tender, bowel sounds active all four quadrants, no masses, no organomegaly Extremities: Extremities normal, atraumatic, no cyanosis or edema Neuro: oriented x3, nonfocal  Lab Results: Results for orders placed during the hospital encounter of 05/08/13 (from the past 48 hour(s))  ETHANOL     Status: None   Collection Time    05/08/13  7:28 PM      Result Value Ref Range   Alcohol, Ethyl (B) <11  0 - 11 mg/dL   Comment:            LOWEST DETECTABLE LIMIT FOR     SERUM ALCOHOL IS 11 mg/dL     FOR MEDICAL PURPOSES ONLY  PROTIME-INR     Status: None   Collection Time    05/08/13  7:28 PM      Result Value Ref Range   Prothrombin Time 12.2  11.6 - 15.2 seconds   INR 0.92  0.00 - 1.49  APTT     Status: None   Collection Time    05/08/13  7:28 PM      Result Value Ref Range   aPTT 28  24 - 37 seconds  CBC     Status: None   Collection Time    05/08/13  7:28 PM      Result Value Ref Range   WBC 5.4  4.0 - 10.5 K/uL   RBC 4.53  3.87 - 5.11 MIL/uL   Hemoglobin 13.3  12.0 - 15.0 g/dL   HCT 38.6  36.0 - 46.0 %   MCV 85.2  78.0  - 100.0 fL   MCH 29.4  26.0 - 34.0 pg   MCHC 34.5  30.0 - 36.0 g/dL   RDW 13.2  11.5 - 15.5 %   Platelets 156  150 - 400 K/uL  DIFFERENTIAL     Status: None   Collection Time    05/08/13  7:28 PM      Result Value Ref Range   Neutrophils Relative % 55  43 - 77 %   Neutro Abs 3.0  1.7 - 7.7 K/uL   Lymphocytes Relative 37  12 - 46 %   Lymphs Abs 2.0  0.7 - 4.0 K/uL   Monocytes Relative 5  3 - 12 %   Monocytes Absolute 0.3  0.1 - 1.0 K/uL   Eosinophils Relative 2  0 - 5 %   Eosinophils Absolute 0.1  0.0 - 0.7 K/uL   Basophils Relative 0  0 - 1 %   Basophils Absolute 0.0  0.0 - 0.1 K/uL  COMPREHENSIVE METABOLIC PANEL  Status: Abnormal   Collection Time    05/08/13  7:28 PM      Result Value Ref Range   Sodium 143  137 - 147 mEq/L   Potassium 3.9  3.7 - 5.3 mEq/L   Chloride 101  96 - 112 mEq/L   CO2 27  19 - 32 mEq/L   Glucose, Bld 98  70 - 99 mg/dL   BUN 12  6 - 23 mg/dL   Creatinine, Ser 0.62  0.50 - 1.10 mg/dL   Calcium 9.6  8.4 - 10.5 mg/dL   Total Protein 7.3  6.0 - 8.3 g/dL   Albumin 4.1  3.5 - 5.2 g/dL   AST 19  0 - 37 U/L   ALT 19  0 - 35 U/L   Alkaline Phosphatase 83  39 - 117 U/L   Total Bilirubin 0.2 (*) 0.3 - 1.2 mg/dL   GFR calc non Af Amer 88 (*) >90 mL/min   GFR calc Af Amer >90  >90 mL/min   Comment: (NOTE)     The eGFR has been calculated using the CKD EPI equation.     This calculation has not been validated in all clinical situations.     eGFR's persistently <90 mL/min signify possible Chronic Kidney     Disease.  Randolm Idol, ED     Status: None   Collection Time    05/08/13  7:54 PM      Result Value Ref Range   Troponin i, poc 0.00  0.00 - 0.08 ng/mL   Comment 3            Comment: Due to the release kinetics of cTnI,     a negative result within the first hours     of the onset of symptoms does not rule out     myocardial infarction with certainty.     If myocardial infarction is still suspected,     repeat the test at appropriate  intervals.  I-STAT CHEM 8, ED     Status: None   Collection Time    05/08/13  7:56 PM      Result Value Ref Range   Sodium 141  137 - 147 mEq/L   Potassium 3.8  3.7 - 5.3 mEq/L   Chloride 101  96 - 112 mEq/L   BUN 10  6 - 23 mg/dL   Creatinine, Ser 0.70  0.50 - 1.10 mg/dL   Glucose, Bld 99  70 - 99 mg/dL   Calcium, Ion 1.25  1.13 - 1.30 mmol/L   TCO2 26  0 - 100 mmol/L   Hemoglobin 13.9  12.0 - 15.0 g/dL   HCT 41.0  36.0 - 46.0 %  URINE RAPID DRUG SCREEN (HOSP PERFORMED)     Status: None   Collection Time    05/08/13  9:29 PM      Result Value Ref Range   Opiates NONE DETECTED  NONE DETECTED   Cocaine NONE DETECTED  NONE DETECTED   Benzodiazepines NONE DETECTED  NONE DETECTED   Amphetamines NONE DETECTED  NONE DETECTED   Tetrahydrocannabinol NONE DETECTED  NONE DETECTED   Barbiturates NONE DETECTED  NONE DETECTED   Comment:            DRUG SCREEN FOR MEDICAL PURPOSES     ONLY.  IF CONFIRMATION IS NEEDED     FOR ANY PURPOSE, NOTIFY LAB     WITHIN 5 DAYS.  LOWEST DETECTABLE LIMITS     FOR URINE DRUG SCREEN     Drug Class       Cutoff (ng/mL)     Amphetamine      1000     Barbiturate      200     Benzodiazepine   528     Tricyclics       413     Opiates          300     Cocaine          300     THC              50  URINALYSIS, ROUTINE W REFLEX MICROSCOPIC     Status: Abnormal   Collection Time    05/08/13  9:29 PM      Result Value Ref Range   Color, Urine YELLOW  YELLOW   APPearance CLOUDY (*) CLEAR   Specific Gravity, Urine 1.009  1.005 - 1.030   pH 6.5  5.0 - 8.0   Glucose, UA NEGATIVE  NEGATIVE mg/dL   Hgb urine dipstick NEGATIVE  NEGATIVE   Bilirubin Urine NEGATIVE  NEGATIVE   Ketones, ur NEGATIVE  NEGATIVE mg/dL   Protein, ur NEGATIVE  NEGATIVE mg/dL   Urobilinogen, UA 0.2  0.0 - 1.0 mg/dL   Nitrite NEGATIVE  NEGATIVE   Leukocytes, UA MODERATE (*) NEGATIVE  URINE MICROSCOPIC-ADD ON     Status: Abnormal   Collection Time    05/08/13  9:29 PM       Result Value Ref Range   Squamous Epithelial / LPF FEW (*) RARE   WBC, UA 11-20  <3 WBC/hpf   Bacteria, UA RARE  RARE  GLUCOSE, CAPILLARY     Status: Abnormal   Collection Time    05/09/13 12:51 AM      Result Value Ref Range   Glucose-Capillary 294 (*) 70 - 99 mg/dL  LIPID PANEL     Status: None   Collection Time    05/09/13  4:01 AM      Result Value Ref Range   Cholesterol 146  0 - 200 mg/dL   Triglycerides 40  <150 mg/dL   HDL 79  >39 mg/dL   Total CHOL/HDL Ratio 1.8     VLDL 8  0 - 40 mg/dL   LDL Cholesterol 59  0 - 99 mg/dL   Comment:            Total Cholesterol/HDL:CHD Risk     Coronary Heart Disease Risk Table                         Men   Women      1/2 Average Risk   3.4   3.3      Average Risk       5.0   4.4      2 X Average Risk   9.6   7.1      3 X Average Risk  23.4   11.0                Use the calculated Patient Ratio     above and the CHD Risk Table     to determine the patient's CHD Risk.                ATP III CLASSIFICATION (LDL):      <100     mg/dL   Optimal  100-129  mg/dL   Near or Above                        Optimal      130-159  mg/dL   Borderline      160-189  mg/dL   High      >190     mg/dL   Very High  GLUCOSE, CAPILLARY     Status: Abnormal   Collection Time    05/09/13  7:54 AM      Result Value Ref Range   Glucose-Capillary 218 (*) 70 - 99 mg/dL  GLUCOSE, CAPILLARY     Status: Abnormal   Collection Time    05/09/13 11:36 AM      Result Value Ref Range   Glucose-Capillary 283 (*) 70 - 99 mg/dL    Studies/Results: Ct Head Wo Contrast  05/08/2013   CLINICAL DATA:  Left temporal headache.  Visual disturbance.  EXAM: CT HEAD WITHOUT CONTRAST  TECHNIQUE: Contiguous axial images were obtained from the base of the skull through the vertex without intravenous contrast.  COMPARISON:  MR HEAD W/O CM dated 04/26/2012; MRA HEAD W/O CM dated 04/26/2012; CT HEAD W/O CM dated 04/25/2012  FINDINGS: Ventricular system normal in size and  appearance for age. No mass lesion. No midline shift. No acute hemorrhage or hematoma. No extra-axial fluid collections. No evidence of acute infarction. No significant interval change.  No focal osseous abnormality involving the skull. Visualized paranasal sinuses, bilateral mastoid air cells, and bilateral middle ear cavities well-aerated. Bilateral carotid siphon and right vertebral artery atherosclerosis.  IMPRESSION: 1. No acute intracranial abnormality. Bilateral carotid siphon atherosclerosis. 2. Stable examination.   Electronically Signed   By: Evangeline Dakin M.D.   On: 05/08/2013 20:25   Medications: Scheduled Meds: . aspirin  325 mg Oral Daily  . atorvastatin  20 mg Oral Daily  . glimepiride  2 mg Oral QAC breakfast  . heparin  5,000 Units Subcutaneous 3 times per day  . insulin aspart  0-5 Units Subcutaneous QHS  . insulin aspart  0-9 Units Subcutaneous TID WC  . LORazepam  2 mg Intravenous Once  . metFORMIN  1,000 mg Oral BID WC   Continuous Infusions:  PRN Meds:.acetaminophen, albuterol  Assessment/Plan: Principal Problem:   Aphasia - resolved Active Problems:   TIA (transient ischemic attack) - symptomatically resolved   DM2 (diabetes mellitus, type 2) - blood sugar a moderately   Headache - improved    Extensive neurological workup planned and ongoing. If negative, can likely chalk these syndromes up to a migraine syndrome. We'll need to monitor her overnight with discharge home in a.m. If workup negative    LOS: 1 day   Henrine Screws, MD 05/09/2013, 1:03 PM

## 2013-05-09 NOTE — Consult Note (Signed)
Referring Physician: Dr. Posey Pronto    Chief Complaint: Transient visual and speech changes associated with the headache.  HPI: Anna Norris is an 73 y.o. female with a history of diabetes mellitus, hyperlipidemia, and an episode of migraine versus TIA 1 year ago, presenting with transient visual field defect and expressive aphasia with associated headache of moderate severity. Onset of symptoms was at 6 PM. Speech and visual changes had resolved by the time he arrived in the emergency. Headache persisted, however. Stroke risk assessment one year ago was unremarkable. CT scan of her head tonight showed no acute intracranial abnormality. Patient takes aspirin daily for antiplatelet therapy but did not take aspirin for the past 2 days because of an episode of epistaxis 3 days ago. NIH stroke score at the time of this evaluation was 0.  LSN: 6 PM on 05/08/2013 tPA Given: No: Rapid resolution of deficits MRankin: 0   Past Medical History  Diagnosis Date  . Tachycardia     no problems now.  . Asthma   . Diabetes mellitus     A1C < 7    Family History  Problem Relation Age of Onset  . Stroke Neg Hx   . Heart failure Father   . Lymphoma Brother      Medications: I have reviewed the patient's current medications.  ROS: History obtained from the patient  General ROS: negative for - chills, fatigue, fever, night sweats, weight gain or weight loss Psychological ROS: negative for - behavioral disorder, hallucinations, memory difficulties, mood swings or suicidal ideation Ophthalmic ROS: negative for - blurry vision, double vision, eye pain or loss of vision ENT ROS: negative for - epistaxis, nasal discharge, oral lesions, sore throat, tinnitus or vertigo Allergy and Immunology ROS: negative for - hives or itchy/watery eyes Hematological and Lymphatic ROS: negative for - bleeding problems, bruising or swollen lymph nodes Endocrine ROS: negative for - galactorrhea, hair pattern changes,  polydipsia/polyuria or temperature intolerance Respiratory ROS: Nasal irritation with epistaxis 3 days ago Cardiovascular ROS: negative for - chest pain, dyspnea on exertion, edema or irregular heartbeat Gastrointestinal ROS: negative for - abdominal pain, diarrhea, hematemesis, nausea/vomiting or stool incontinence Genito-Urinary ROS: negative for - dysuria, hematuria, incontinence or urinary frequency/urgency Musculoskeletal ROS: negative for - joint swelling or muscular weakness Neurological ROS: as noted in HPI Dermatological ROS: negative for rash and skin lesion changes  Physical Examination: Blood pressure 171/75, pulse 82, temperature 97.5 F (36.4 C), temperature source Oral, resp. rate 18, height 5\' 6"  (1.676 m), weight 80.105 kg (176 lb 9.6 oz), SpO2 96.00%.  Neurologic Examination: Mental Status: Alert, oriented, thought content appropriate.  Speech fluent without evidence of aphasia. Able to follow commands without difficulty. Cranial Nerves: II-Visual fields were normal. III/IV/VI-Pupils were equal and reacted. Extraocular movements were full and conjugate.    V/VII-no facial numbness and no facial weakness. VIII-normal. X-normal speech and symmetrical palatal movement. Motor: 5/5 bilaterally with normal tone and bulk Sensory: Normal throughout. Deep Tendon Reflexes: 1+ and symmetric. Plantars: Mute bilaterally Cerebellar: Normal finger-to-nose testing. Carotid auscultation: Normal  Ct Head Wo Contrast  05/08/2013   CLINICAL DATA:  Left temporal headache.  Visual disturbance.  EXAM: CT HEAD WITHOUT CONTRAST  TECHNIQUE: Contiguous axial images were obtained from the base of the skull through the vertex without intravenous contrast.  COMPARISON:  MR HEAD W/O CM dated 04/26/2012; MRA HEAD W/O CM dated 04/26/2012; CT HEAD W/O CM dated 04/25/2012  FINDINGS: Ventricular system normal in size and appearance for age. No  mass lesion. No midline shift. No acute hemorrhage or hematoma. No  extra-axial fluid collections. No evidence of acute infarction. No significant interval change.  No focal osseous abnormality involving the skull. Visualized paranasal sinuses, bilateral mastoid air cells, and bilateral middle ear cavities well-aerated. Bilateral carotid siphon and right vertebral artery atherosclerosis.  IMPRESSION: 1. No acute intracranial abnormality. Bilateral carotid siphon atherosclerosis. 2. Stable examination.   Electronically Signed   By: Evangeline Dakin M.D.   On: 05/08/2013 20:25    Assessment: 73 y.o. female with risk factors for stroke presenting with probable recurrent migraine phenomenon. However, TIA cannot be ruled out.  Stroke Risk Factors - diabetes mellitus and hyperlipidemia  Plan: 1. HgbA1c, fasting lipid panel 2. MRI, MRA  of the brain without contrast 3. PT consult, OT consult, Speech consult 4. Echocardiogram 5. Carotid dopplers 6. Prophylactic therapy-Antiplatelet med: Aspirin  7. Risk factor modification 8. Telemetry monitoring  C.R. Nicole Kindred, MD [facility] [phone]  05/09/2013, 1:07 AM

## 2013-05-09 NOTE — Progress Notes (Signed)
UR completed 

## 2013-05-09 NOTE — ED Provider Notes (Signed)
I have personally seen and examined the patient.  I have discussed the plan of care with the resident.  I have reviewed the documentation on PMH/FH/Soc. History.  I have reviewed the documentation of the resident and agree.  I have reviewed and agree with the ECG interpretation(s) documented by the resident.   Sharyon Cable, MD 05/09/13 1006

## 2013-05-09 NOTE — Evaluation (Signed)
Physical Therapy Evaluation Patient Details Name: Anna Norris MRN: [id number] DOB: 02/17/1941 Today's Date: 05/09/2013 Time: 8546-2703 PT Time Calculation (min): 20 min  PT Assessment / Plan / Recommendation History of Present Illness  73 y.o. female admitted to [facility] on 05/08/13 with Past medical history of migraine, diabetes, asthma.  She presents with vision changes and speech changes on admission.  CT clear.  MRI pending.  Possible TIA vs complex migraine.    Clinical Impression  Pt is independent with all mobility. Speech and vision are back to baseline (she wears glasses to read, but no more central loss of vision).  Balance and strength in all extremities WNL.  Pt has no acute or f/u PT or OT needs at this time.  PT/OT to sign off.      PT Assessment  Patent does not need any further PT services    Follow Up Recommendations  No PT follow up    Does the patient have the potential to tolerate intense rehabilitation     NA  Barriers to Discharge  None      Equipment Recommendations  None recommended by PT    Recommendations for Other Services   None  Frequency   NA- one time eval and d/c   Precautions / Restrictions Precautions Precautions: None   Pertinent Vitals/Pain See vitals flow sheet.      Mobility  Bed Mobility Overal bed mobility: Independent Transfers Overall transfer level: Independent Ambulation/Gait Ambulation/Gait assistance: Independent Ambulation Distance (Feet): 300 Feet Assistive device: None Stairs: Yes Stairs assistance: Modified independent (Device/Increase time) Stair Management: One rail Left;Alternating pattern;Forwards Number of Stairs: 9 Modified Rankin (Stroke Patients Only) Pre-Morbid Rankin Score: No symptoms Modified Rankin: No symptoms        PT Goals(Current goals can be found in the care plan section) Acute Rehab PT Goals Patient Stated Goal: to go home PT Goal Formulation: No goals set, d/c therapy  Visit  Information  Last PT Received On: 05/09/13 Assistance Needed: +1 Reason Eval/Treat Not Completed: Patient at procedure or test/unavailable History of Present Illness: 73 y.o. female admitted to [facility] ([place] on 05/08/13 with Past medical history of migraine, diabetes, asthma.  She presents with vision changes and speech changes on admission.  CT clear.  MRI pending.  Possible TIA vs complex migraine.         Prior [place] expects to be discharged to:: Private residence Living Arrangements: Spouse/significant other Available Help at Discharge: Family;Available 24 hours/day Type of Home: House Home Access: Stairs to enter Home Layout: Two level;Bed/bath upstairs Alternate Level Stairs-Number of Steps: flight Alternate Level Stairs-Rails: Left Home Equipment: None Prior Function Level of Independence: Independent Comments: pt works as a Secondary school teacher and drives. Her husband is retired and at home all day.   Communication Communication: No difficulties Dominant Hand: Right    Cognition  Cognition Arousal/Alertness: Awake/alert Behavior During Therapy: WFL for tasks assessed/performed Overall Cognitive Status: Within Functional Limits for tasks assessed    Extremity/Trunk Assessment Upper Extremity Assessment Upper Extremity Assessment: Overall WFL for tasks assessed (strength, coordination, and sensation WNL) Lower Extremity Assessment Lower Extremity Assessment: Overall WFL for tasks assessed (Strength, coordination, and sensation WNL) Cervical / Trunk Assessment Cervical / Trunk Assessment: Normal (pt reports h/o degenerative arthritis in her neck)   Balance Balance Overall balance assessment: No apparent balance deficits (not formally assessed) Standardized Balance Assessment Standardized Balance Assessment : Dynamic Gait Index Dynamic Gait Index Level Surface: Normal Change in Gait Speed: Normal  Gait with Horizontal Head Turns: Normal Gait with  Vertical Head Turns: Normal Gait and Pivot Turn: Normal Step Over Obstacle: Normal Step Around Obstacles: Normal Steps: Normal Total Score: 24  End of Session PT - End of Session Activity Tolerance: Patient tolerated treatment well Patient left: in bed;with call bell/phone within reach;Other (comment) (seated EOB)  GP Functional Assessment Tool Used: assist level Functional Limitation: Mobility: Walking and moving around Mobility: Walking and Moving Around Current Status [phone]): 0 percent impaired, limited or restricted Mobility: Walking and Moving Around Goal Status ([id number]): 0 percent impaired, limited or restricted Mobility: Walking and Moving Around Discharge Status [phone]): 0 percent impaired, limited or restricted   Toshiyuki Fredell B. [place], [place], DPT [phone]   05/09/2013, 12:25 PM

## 2013-05-10 LAB — GLUCOSE, CAPILLARY
GLUCOSE-CAPILLARY: 162 mg/dL — AB (ref 70–99)
Glucose-Capillary: 217 mg/dL — ABNORMAL HIGH (ref 70–99)

## 2013-05-10 NOTE — Progress Notes (Signed)
VASCULAR LAB PRELIMINARY  PRELIMINARY  PRELIMINARY  PRELIMINARY  Carotid Dopplers completed.    Preliminary report:  There is 1-39% ICA stenosis.  Vertebral artery flow is adequate.  Saydi Kobel, RVT 05/10/2013, 11:01 AM

## 2013-05-10 NOTE — Progress Notes (Signed)
Stroke Team Progress Note  HISTORY Anna Norris is an 73 y.o. female with a history of diabetes mellitus, hyperlipidemia, and an episode of migraine versus TIA 1 year ago, presenting with transient visual field defect and expressive aphasia with associated headache of moderate severity. Onset of symptoms was at 6 PM. Speech and visual changes had resolved by the time he arrived in the emergency. Headache persisted, however. Stroke risk assessment one year ago was unremarkable. CT scan of her head tonight showed no acute intracranial abnormality. Patient takes aspirin daily for antiplatelet therapy but did not take aspirin for the past 2 days because of an episode of epistaxis 3 days ago. NIH stroke score at the time of this evaluation was 0.  LSN: 6 PM on 05/08/2013  tPA Given: No: Rapid resolution of deficits  MRankin: 0   SUBJECTIVE No family is at bedside. The patient feels back to baseline.  OBJECTIVE Most recent Vital Signs: Filed Vitals:   05/09/13 2356 05/10/13 0400 05/10/13 0514 05/10/13 0800  BP: 143/68 156/73 141/79 135/74  Pulse: 82 72 72 71  Temp: 98.1 F (36.7 C) 98 F (36.7 C) 97.8 F (36.6 C) 97 F (36.1 C)  TempSrc: Axillary Oral  Oral  Resp: 18 18 18 19   Height:      Weight: 176 lb 5.5 oz (79.988 kg)     SpO2: 99% 100% 99% 98%   CBG (last 3)   Recent Labs  05/09/13 1647 05/09/13 2206 05/10/13 0805  GLUCAP 214* 198* 162*    IV Fluid Intake:     MEDICATIONS  . aspirin  325 mg Oral Daily  . atorvastatin  20 mg Oral Daily  . glimepiride  2 mg Oral QAC breakfast  . heparin  5,000 Units Subcutaneous 3 times per day  . insulin aspart  0-5 Units Subcutaneous QHS  . insulin aspart  0-9 Units Subcutaneous TID WC  . metFORMIN  1,000 mg Oral BID WC   PRN:  acetaminophen, albuterol  Diet:  Cardiac thin liquids Activity:   Up with assistance DVT Prophylaxis:  SQ Heparin  CLINICALLY SIGNIFICANT STUDIES Basic Metabolic Panel:  Recent Labs Lab 05/08/13 1928  05/08/13 1956  NA 143 141  K 3.9 3.8  CL 101 101  CO2 27  --   GLUCOSE 98 99  BUN 12 10  CREATININE 0.62 0.70  CALCIUM 9.6  --    Liver Function Tests:  Recent Labs Lab 05/08/13 1928  AST 19  ALT 19  ALKPHOS 83  BILITOT 0.2*  PROT 7.3  ALBUMIN 4.1   CBC:  Recent Labs Lab 05/08/13 1928 05/08/13 1956  WBC 5.4  --   NEUTROABS 3.0  --   HGB 13.3 13.9  HCT 38.6 41.0  MCV 85.2  --   PLT 156  --    Coagulation:  Recent Labs Lab 05/08/13 1928  LABPROT 12.2  INR 0.92   Cardiac Enzymes: No results found for this basename: CKTOTAL, CKMB, CKMBINDEX, TROPONINI,  in the last 168 hours Urinalysis:  Recent Labs Lab 05/08/13 2129  COLORURINE YELLOW  LABSPEC 1.009  PHURINE 6.5  GLUCOSEU NEGATIVE  HGBUR NEGATIVE  BILIRUBINUR NEGATIVE  KETONESUR NEGATIVE  PROTEINUR NEGATIVE  UROBILINOGEN 0.2  NITRITE NEGATIVE  LEUKOCYTESUR MODERATE*   Lipid Panel    Component Value Date/Time   CHOL 146 05/09/2013 0401   TRIG 40 05/09/2013 0401   HDL 79 05/09/2013 0401   CHOLHDL 1.8 05/09/2013 0401   VLDL 8 05/09/2013 0401  LDLCALC 59 05/09/2013 0401   HgbA1C  Lab Results  Component Value Date   HGBA1C 7.4* 05/09/2013    Urine Drug Screen:     Component Value Date/Time   LABOPIA NONE DETECTED 05/08/2013 2129   COCAINSCRNUR NONE DETECTED 05/08/2013 2129   LABBENZ NONE DETECTED 05/08/2013 2129   AMPHETMU NONE DETECTED 05/08/2013 2129   THCU NONE DETECTED 05/08/2013 2129   LABBARB NONE DETECTED 05/08/2013 2129    Alcohol Level:  Recent Labs Lab 05/08/13 1928  ETH <11    Ct Head Wo Contrast  05/08/2013   CLINICAL DATA:  Left temporal headache.  Visual disturbance.  EXAM: CT HEAD WITHOUT CONTRAST  TECHNIQUE: Contiguous axial images were obtained from the base of the skull through the vertex without intravenous contrast.  COMPARISON:  MR HEAD W/O CM dated 04/26/2012; MRA HEAD W/O CM dated 04/26/2012; CT HEAD W/O CM dated 04/25/2012  FINDINGS: Ventricular system normal in size and  appearance for age. No mass lesion. No midline shift. No acute hemorrhage or hematoma. No extra-axial fluid collections. No evidence of acute infarction. No significant interval change.  No focal osseous abnormality involving the skull. Visualized paranasal sinuses, bilateral mastoid air cells, and bilateral middle ear cavities well-aerated. Bilateral carotid siphon and right vertebral artery atherosclerosis.  IMPRESSION: 1. No acute intracranial abnormality. Bilateral carotid siphon atherosclerosis. 2. Stable examination.   Electronically Signed   By: Evangeline Dakin M.D.   On: 05/08/2013 20:25   Mri Brain Without Contrast  05/10/2013   CLINICAL DATA:  Left-sided headache. Word finding difficulty now resolved.  EXAM: MRI HEAD WITHOUT CONTRAST  TECHNIQUE: Multiplanar, multiecho pulse sequences of the brain and surrounding structures were obtained without intravenous contrast.  COMPARISON:  CT HEAD W/O CM dated 05/08/2013  FINDINGS: No reduced diffusion to suggest acute ischemia. No susceptibility artifact to suggest hemorrhage.  The ventricles and sulci are normal for patient's age. Patchy supratentorial white matter FLAIR T2 hyperintensities are less than expected for age without midline shift or mass effect on the mildly motion degraded axial T2 FLAIR.  No abnormal extra-axial fluid collections. Normal major intracranial vascular flow voids seen at the skull base.  The paranasal sinuses and mastoid air cells are well aerated. Ocular globes and orbital contents are unremarkable though not tailored for evaluation. No abnormal sellar expansion. Posteriorly directed odontoid process with mild pannus may reflect CPPD partially effacing the ventral CSF space though the spinal canal remains widely patent. No cerebellar tonsillar ectopia.  IMPRESSION: No acute intracranial process or specific findings to explain left-sided headache. Normal noncontrast MRI of the brain for age.   Electronically Signed   By: Elon Alas   On: 05/10/2013 02:44    CT of the brain   IMPRESSION:  1. No acute intracranial abnormality. Bilateral carotid siphon  atherosclerosis.  2. Stable examination.  MRI of the brain   IMPRESSION:  No acute intracranial process or specific findings to explain  left-sided headache. Normal noncontrast MRI of the brain for age.   MRA of the brain    2D Echocardiogram   Study Conclusions  - Left ventricle: The cavity size was normal. Wall thickness was normal. Systolic function was normal. The estimated ejection fraction was in the range of 55% to 60%. Wall motion was normal; there were no regional wall motion abnormalities. Doppler parameters are consistent with abnormal left ventricular relaxation (grade 1 diastolic dysfunction). - Pericardium, extracardiac: A trivial pericardial effusion was identified. Impressions:  - Compared to study dated 05/06/12,  small pericardial effusion now trivial.   Carotid Doppler    CXR    EKG   Sinus rhythm Non-specific ST-t changes No significant change since last tracing  Therapy Recommendations No rehab needed  Physical Exam  General: The patient is alert and cooperative at the time of the examination.  Skin: No significant peripheral edema is noted.   Neurologic Exam  Mental status: The patient is oriented x 3.  Cranial nerves: Facial symmetry is present. Speech is normal, no aphasia or dysarthria is noted. Extraocular movements are full. Visual fields are full.  Motor: The patient has good strength in all 4 extremities.  Sensory examination: Soft touch sensation on the face, arms, and legs is symmetric.  Coordination: The patient has good finger-nose-finger and heel-to-shin bilaterally.  Gait and station: The gait was not tested.  Reflexes: Deep tendon reflexes are symmetric.    ASSESSMENT Anna Norris is a 73 y.o. female presenting with transient loss of central vision, aphasia. This patient has a  prior history of migraine headaches associated with visual aura. Patient past has had migraine equivalent, and visual aura associated with headache. This particular episode was associated with the headache following the visual field and aphasia event. The patient reports loss of central vision, with preserved peripheral vision bilaterally. The patient was off of aspirin 2 days prior to the event above. Patient stopped the medication secondary to epistaxis.   Migraine headache, classic migraine  Asthma  Diabetes  Hospital day # 2  TREATMENT/PLAN  Continue aspirin for now.  Carotid Doppler pending carotid Doppler study done one year ago reveals 40-59% stenosis of the right internal carotid artery, less than 39% stenosis of the left internal carotid artery. Antegrade vertebral artery flow is noted.  The event leading to this hospitalization likely did not represent a TIA, this likely was a migraine episode. I would continue the patient on aspirin for now. The patient may be discharged home following a carotid Doppler study.  Lenor Coffin [phone] 05/10/2013 9:53 AM

## 2013-05-10 NOTE — Discharge Summary (Signed)
Physician Discharge Summary  Patient ID: Anna Norris MRN: [id number] DOB/AGE: [birth year] 73 y.o.  Admit date: 05/08/2013 Discharge date: 05/10/2013  Admission Diagnoses: Dysphagia Diabetes mellitus type 2  Discharge Diagnoses:  Principal Problem:   Aphasia Active Problems:   Migraine   DM2 (diabetes mellitus, type 2)     Discharged Condition: good  Hospital Course: The patient was admitted on February 19 after an episode of a left-sided headache into trouble focusing on her central field of vision. She was having trouble with getting her words out. Her symptoms resolved by the time she arrived at the emergency department. She had a similar admission about a year ago. Her initial CT scan of the brain showed no acute intracranial abnormality. She was seen by Dr. Jannifer Franklin of neurology. His impression was probable recurrent migraine phenomenon, rule out TIA. The patient had an MRI of the brain was normal, no evidence of CVA. 2-D echocardiogram showed normal LV size and function ejection fraction 33-29%, grade 1 diastolic dysfunction. Carotid ultrasound pending at time of discharge , the, preliminary showed no significant change. The final neurologic evaluation was migraine headache with classic migraine, likely not a TIA.Marland Kitchen Recommended continuing aspirin.  Consults: neurology  Significant Diagnostic Studies: labs: Hemoglobin A1c 7.4, cholesterol 146, LDL 59, HDL 79, sodium 133, potassium 2.9, chloride 101, bicarbonate 27, BUN 12, creatinine 0.62 and radiology: MRI: As above and CT scan: As above  Treatments: anticoagulation: ASA  Discharge Exam: Blood pressure 135/74, pulse 71, temperature 97 F (36.1 C), temperature source Oral, resp. rate 19, height 5\' 6"  (1.676 m), weight 79.988 kg (176 lb 5.5 oz), SpO2 98.00%. General appearance: alert and cooperative Resp: clear to auscultation bilaterally Cardio: regular rate and rhythm, S1, S2 normal, no murmur, click, rub or gallop  Disposition:  01-Home or Self Care   Future Appointments Provider Department Dept Phone   05/23/2013 3:30 PM [name], NP [facility] [phone]       Medication List         acetaminophen 500 MG tablet  Commonly known as:  TYLENOL  Take 500 mg by mouth daily as needed for headache.     aspirin 81 MG tablet  Take 1 tablet (81 mg total) by mouth daily.     atorvastatin 20 MG tablet  Commonly known as:  LIPITOR  Take 20 mg by mouth daily.     glimepiride 2 MG tablet  Commonly known as:  AMARYL  Take 2 mg by mouth daily before breakfast.     metFORMIN 500 MG tablet  Commonly known as:  GLUCOPHAGE  Take 1,000 mg by mouth 2 (two) times daily with a meal.     PROAIR HFA 108 (90 BASE) MCG/ACT inhaler  Generic drug:  albuterol  Inhale 2 puffs into the lungs every 4 (four) hours as needed.           Follow-up Information   Follow up with Horton Finer, MD In 2 weeks.   Specialty:  Internal Medicine   Contact information:   [address], [address] [place] [place] [address] [phone]       Signed: Irven Shelling 05/10/2013, 11:35 AM

## 2013-05-19 ENCOUNTER — Ambulatory Visit: Payer: Medicare Other | Admitting: Nurse Practitioner

## 2013-05-23 ENCOUNTER — Ambulatory Visit: Payer: Medicare Other | Admitting: Nurse Practitioner

## 2014-01-14 ENCOUNTER — Ambulatory Visit
Admission: RE | Admit: 2014-01-14 | Discharge: 2014-01-14 | Disposition: A | Discharge status: Home / Self Care | Payer: Commercial Managed Care - HMO | Source: Ambulatory Visit | Attending: Internal Medicine | Admitting: Internal Medicine

## 2014-01-14 ENCOUNTER — Other Ambulatory Visit: Payer: Self-pay | Admitting: Internal Medicine

## 2014-01-14 DIAGNOSIS — M79671 Pain in right foot: Secondary | ICD-10-CM

## 2014-01-14 DIAGNOSIS — M25571 Pain in right ankle and joints of right foot: Secondary | ICD-10-CM

## 2014-03-26 DIAGNOSIS — M19071 Primary osteoarthritis, right ankle and foot: Secondary | ICD-10-CM | POA: Diagnosis not present

## 2014-03-26 DIAGNOSIS — M25571 Pain in right ankle and joints of right foot: Secondary | ICD-10-CM | POA: Insufficient documentation

## 2014-03-27 DIAGNOSIS — L570 Actinic keratosis: Secondary | ICD-10-CM | POA: Diagnosis not present

## 2014-03-27 DIAGNOSIS — L821 Other seborrheic keratosis: Secondary | ICD-10-CM | POA: Diagnosis not present

## 2014-04-16 DIAGNOSIS — M25571 Pain in right ankle and joints of right foot: Secondary | ICD-10-CM | POA: Diagnosis not present

## 2014-04-22 DIAGNOSIS — J209 Acute bronchitis, unspecified: Secondary | ICD-10-CM | POA: Diagnosis not present

## 2014-04-22 DIAGNOSIS — M899 Disorder of bone, unspecified: Secondary | ICD-10-CM | POA: Diagnosis not present

## 2014-04-22 DIAGNOSIS — M8580 Other specified disorders of bone density and structure, unspecified site: Secondary | ICD-10-CM | POA: Diagnosis not present

## 2014-04-23 DIAGNOSIS — M25671 Stiffness of right ankle, not elsewhere classified: Secondary | ICD-10-CM | POA: Diagnosis not present

## 2014-04-23 DIAGNOSIS — M25571 Pain in right ankle and joints of right foot: Secondary | ICD-10-CM | POA: Diagnosis not present

## 2014-04-30 DIAGNOSIS — M25571 Pain in right ankle and joints of right foot: Secondary | ICD-10-CM | POA: Diagnosis not present

## 2014-04-30 DIAGNOSIS — M25671 Stiffness of right ankle, not elsewhere classified: Secondary | ICD-10-CM | POA: Diagnosis not present

## 2014-05-14 DIAGNOSIS — R262 Difficulty in walking, not elsewhere classified: Secondary | ICD-10-CM | POA: Diagnosis not present

## 2014-05-14 DIAGNOSIS — M25571 Pain in right ankle and joints of right foot: Secondary | ICD-10-CM | POA: Diagnosis not present

## 2014-05-14 DIAGNOSIS — M25671 Stiffness of right ankle, not elsewhere classified: Secondary | ICD-10-CM | POA: Diagnosis not present

## 2014-05-28 DIAGNOSIS — M25571 Pain in right ankle and joints of right foot: Secondary | ICD-10-CM | POA: Diagnosis not present

## 2014-05-28 DIAGNOSIS — R262 Difficulty in walking, not elsewhere classified: Secondary | ICD-10-CM | POA: Diagnosis not present

## 2014-05-28 DIAGNOSIS — M25671 Stiffness of right ankle, not elsewhere classified: Secondary | ICD-10-CM | POA: Diagnosis not present

## 2014-09-08 DIAGNOSIS — E1140 Type 2 diabetes mellitus with diabetic neuropathy, unspecified: Secondary | ICD-10-CM | POA: Diagnosis not present

## 2014-09-08 DIAGNOSIS — E119 Type 2 diabetes mellitus without complications: Secondary | ICD-10-CM | POA: Diagnosis not present

## 2015-01-22 ENCOUNTER — Emergency Department (HOSPITAL_BASED_OUTPATIENT_CLINIC_OR_DEPARTMENT_OTHER)
Admission: EM | Admit: 2015-01-22 | Discharge: 2015-01-22 | Disposition: A | Discharge status: Home / Self Care | Payer: Commercial Managed Care - HMO | Attending: Emergency Medicine | Admitting: Emergency Medicine

## 2015-01-22 ENCOUNTER — Encounter (HOSPITAL_BASED_OUTPATIENT_CLINIC_OR_DEPARTMENT_OTHER): Payer: Self-pay | Admitting: *Deleted

## 2015-01-22 DIAGNOSIS — Z79899 Other long term (current) drug therapy: Secondary | ICD-10-CM | POA: Diagnosis not present

## 2015-01-22 DIAGNOSIS — E119 Type 2 diabetes mellitus without complications: Secondary | ICD-10-CM | POA: Insufficient documentation

## 2015-01-22 DIAGNOSIS — H547 Unspecified visual loss: Secondary | ICD-10-CM | POA: Diagnosis not present

## 2015-01-22 DIAGNOSIS — Z7982 Long term (current) use of aspirin: Secondary | ICD-10-CM | POA: Insufficient documentation

## 2015-01-22 DIAGNOSIS — J45909 Unspecified asthma, uncomplicated: Secondary | ICD-10-CM | POA: Diagnosis not present

## 2015-01-22 DIAGNOSIS — H5462 Unqualified visual loss, left eye, normal vision right eye: Secondary | ICD-10-CM | POA: Diagnosis not present

## 2015-01-22 DIAGNOSIS — H53412 Scotoma involving central area, left eye: Secondary | ICD-10-CM

## 2015-01-22 DIAGNOSIS — Z7984 Long term (current) use of oral hypoglycemic drugs: Secondary | ICD-10-CM | POA: Diagnosis not present

## 2015-01-22 DIAGNOSIS — H578 Other specified disorders of eye and adnexa: Secondary | ICD-10-CM | POA: Diagnosis present

## 2015-01-22 NOTE — ED Notes (Signed)
BP reported to Dr. Billy Fischer made aware.  Ok to proceed with discharge.

## 2015-01-22 NOTE — ED Provider Notes (Signed)
CSN: [id number]     Arrival date & time 01/22/15  1220 History   First MD Initiated Contact with Patient 01/22/15 1344     Chief Complaint  Patient presents with  . Eye Problem     (Consider location/radiation/quality/duration/timing/severity/associated sxs/prior Treatment) HPI Comments: Right eye, did get sanding dust in it about 2 weeks ago, now right eye recovered  Used left eye 1 [place], and looked in telescope Loss of central vision, looks at objects and loses the object she focuses on  No blurred vision or diplopia Left eye bought STYE cream and lubricated it No pain, no discharge No contacts No hx of eye propblems    Patient is a 74 y.o. female presenting with eye problem.  Eye Problem Associated symptoms: no discharge, no headaches, no itching, no numbness, no photophobia (no change), no redness and no weakness     Past Medical History  Diagnosis Date  . Tachycardia     no problems now.  . Asthma   . Complication of anesthesia     can not have novacain  . Diabetes mellitus     A1C < 7   Past Surgical History  Procedure Laterality Date  . Uterine cyst    . Tubal ligation    . Hand surgery    . Breast lumpectomy    . Foot surgery     Family History  Problem Relation Age of Onset  . Stroke Neg Hx   . Heart failure Father   . Lymphoma Brother    Social History  Substance Use Topics  . Smoking status: Never Smoker   . Smokeless tobacco: Never Used  . Alcohol Use: Yes     Comment: occassionally   OB History    No data available     Review of Systems  Constitutional: Negative for fever.  HENT: Negative for sore throat.   Eyes: Positive for visual disturbance. Negative for photophobia (no change), pain, discharge, redness and itching.  Respiratory: Negative for cough and shortness of breath.   Cardiovascular: Negative for chest pain.  Gastrointestinal: Negative for abdominal pain.  Genitourinary: Negative for difficulty  urinating.  Musculoskeletal: Negative for back pain and neck pain.  Skin: Negative for rash.  Neurological: Negative for dizziness, syncope, facial asymmetry, speech difficulty, weakness, numbness and headaches.      Allergies  Other; Shellfish allergy; Strawberry extract; and Yellow dyes (non-tartrazine)  Home Medications   Prior to Admission medications   Medication Sig Start Date End Date Taking? Authorizing Provider  acetaminophen (TYLENOL) 500 MG tablet Take 500 mg by mouth daily as needed for headache.    Historical Provider, MD  aspirin 81 MG tablet Take 1 tablet (81 mg total) by mouth daily. 08/14/12   [name], NP  atorvastatin (LIPITOR) 20 MG tablet Take 20 mg by mouth daily.    Historical Provider, MD  glimepiride (AMARYL) 2 MG tablet Take 2 mg by mouth daily before breakfast.    Historical Provider, MD  metFORMIN (GLUCOPHAGE) 500 MG tablet Take 1,000 mg by mouth 2 (two) times daily with a meal.    Historical Provider, MD  PROAIR HFA 108 (90 BASE) MCG/ACT inhaler Inhale 2 puffs into the lungs every 4 (four) hours as needed.  10/07/12   Historical Provider, MD   BP 191/83 mmHg  Pulse 76  Temp(Src) 97.9 F (36.6 C) (Oral)  Resp 20  Ht 5\' 6"  (1.676 m)  Wt 170 lb (77.111 kg)  BMI  27.45 kg/m2  SpO2 98% Physical Exam  Constitutional: She is oriented to person, place, and time. She appears well-developed and well-nourished. No distress.  HENT:  Head: Normocephalic and atraumatic.  Eyes: Conjunctivae and EOM are normal.  Neck: Normal range of motion.  Cardiovascular: Normal rate, regular rhythm, normal heart sounds and intact distal pulses.  Exam reveals no gallop and no friction rub.   No murmur heard. Pulmonary/Chest: Effort normal and breath sounds normal. No respiratory distress. She has no wheezes. She has no rales.  Abdominal: Soft. She exhibits no distension. There is no tenderness. There is no guarding.  Musculoskeletal: She exhibits no edema or tenderness.   Neurological: She is alert and oriented to person, place, and time.  Skin: Skin is warm and dry. No rash noted. She is not diaphoretic. No erythema.  Nursing note and vitals reviewed.   ED Course  Procedures (including critical care time) Labs Review Labs Reviewed - No data to display  Imaging Review No results found. I have personally reviewed and evaluated these images and lab results as part of my medical decision-making.   EKG Interpretation None      MDM   Final diagnoses:  Central loss of vision, left   74yo female with history of DM, TIA, asthma, presents with concern for loss of central vision in the left eye for one week.  Patient reports with left eye she can see things however the area she focuses on in the center of her vision drops out, for example a light switch she can see the switch plate, but when she focuses on the switch wiwth that eye alone cannot see it.  She does not have any neurologic symptoms or signs on exam.  DDx for loss of central vision includes abnormalities with optic nerve or retinal etiology including possible macular degeneration.  Pt with normal pupil reactivity and doubt CNII pathology.  Doubt CVA.  Hx not consistent with retinal detachment. No progression of symptoms that started 1 week ago. Discussed with Dr. Gershon Crane, pt Ophthalmologist and she will follow up with him first thing Monday AM and return to ED if symptoms worsen. Patient discharged in stable condition with understanding of reasons to return.     Gareth Morgan, MD 01/23/15 1327

## 2015-01-22 NOTE — Discharge Instructions (Signed)
Scotoma Scotoma refers to an area of decreased or absent eyesight within your field of vision. This is sometimes called a blind spot. It may appear as a dark or gray area of vision loss (visual field defect) that is surrounded by a ring or an area of blurred vision. There are two types of scotoma:  Central scotoma. In this type, the blind spot is in the middle of the field of vision. A central scotoma may be caused by a problem with your main nerve for vision (optic nerve). It may also be caused by a common eye disease of older age (macular degeneration).  Peripheral scotoma. In this type, the blind spot is at the edge of the field of vision. A peripheral scotoma is often caused by a problem with the cells in the back of your eye (retina) that collect images and send them to your brain through the optic nerve. Other causes of scotoma include multiple sclerosis, stroke, migraine aura, a blood clot to the vessels of the eye, glaucoma, or anything that may block normal light transmission and perception by the retina. To find the cause, you may need to see a health care provider who specializes in eye conditions (ophthalmologist). HOME CARE INSTRUCTIONS  Tell your health care provider about any changes in your scotoma.  Do not drive or operate heavy machinery unless your health care provider approves.  Work with a Psychologist, educational as directed by your health care provider.  When you are reading or doing other activities that involve small objects or small text, try to use good lighting, large print, and magnifying devices.  Take medicines only as directed by your health care provider.  Keep all follow-up visits as directed by your health care provider. This is important. SEEK MEDICAL CARE IF:  Your condition changes.  Your symptoms get worse.  You develop other symptoms, including:  Weakness.  Numbness.  Headache.  Eye pain.  Clumsiness.  Flashing lights in your field of  vision.  Shadowy shapes that move across your field of vision (floaters).  Eye redness. SEEK IMMEDIATE MEDICAL CARE IF:  You have a sudden loss of vision.  You have a sudden severe headache.  You have sudden weakness or numbness.  You lose the ability to speak, understand speech, or both.   This information is not intended to replace advice given to you by your health care provider. Make sure you discuss any questions you have with your health care provider.   Document Released: 04/13/2004 Document Revised: 07/21/2014 Document Reviewed: 01/28/2014 Elsevier Interactive Patient Education [employer].

## 2015-01-22 NOTE — ED Notes (Signed)
States she has a "blank" spot in her left eye for a week. Her MD is out of town and the office advised her to come here. Started after going to the planetarium and looking at stars.

## 2015-01-25 DIAGNOSIS — H35712 Central serous chorioretinopathy, left eye: Secondary | ICD-10-CM | POA: Diagnosis not present

## 2015-01-27 DIAGNOSIS — H5462 Unqualified visual loss, left eye, normal vision right eye: Secondary | ICD-10-CM | POA: Diagnosis not present

## 2015-01-27 DIAGNOSIS — H9192 Unspecified hearing loss, left ear: Secondary | ICD-10-CM | POA: Diagnosis not present

## 2015-01-27 DIAGNOSIS — I779 Disorder of arteries and arterioles, unspecified: Secondary | ICD-10-CM | POA: Diagnosis not present

## 2015-01-27 DIAGNOSIS — E1142 Type 2 diabetes mellitus with diabetic polyneuropathy: Secondary | ICD-10-CM | POA: Diagnosis not present

## 2015-01-28 DIAGNOSIS — H35342 Macular cyst, hole, or pseudohole, left eye: Secondary | ICD-10-CM | POA: Diagnosis not present

## 2015-01-28 DIAGNOSIS — E113393 Type 2 diabetes mellitus with moderate nonproliferative diabetic retinopathy without macular edema, bilateral: Secondary | ICD-10-CM | POA: Diagnosis not present

## 2015-02-15 DIAGNOSIS — H903 Sensorineural hearing loss, bilateral: Secondary | ICD-10-CM | POA: Diagnosis not present

## 2015-02-15 DIAGNOSIS — H9041 Sensorineural hearing loss, unilateral, right ear, with unrestricted hearing on the contralateral side: Secondary | ICD-10-CM | POA: Diagnosis not present

## 2015-02-15 DIAGNOSIS — H9192 Unspecified hearing loss, left ear: Secondary | ICD-10-CM | POA: Diagnosis not present

## 2015-02-17 DIAGNOSIS — H547 Unspecified visual loss: Secondary | ICD-10-CM | POA: Diagnosis not present

## 2015-02-17 DIAGNOSIS — H35342 Macular cyst, hole, or pseudohole, left eye: Secondary | ICD-10-CM | POA: Diagnosis not present

## 2015-02-24 DIAGNOSIS — Z09 Encounter for follow-up examination after completed treatment for conditions other than malignant neoplasm: Secondary | ICD-10-CM | POA: Diagnosis not present

## 2015-02-24 DIAGNOSIS — H4312 Vitreous hemorrhage, left eye: Secondary | ICD-10-CM | POA: Diagnosis not present

## 2015-03-10 DIAGNOSIS — Z09 Encounter for follow-up examination after completed treatment for conditions other than malignant neoplasm: Secondary | ICD-10-CM | POA: Diagnosis not present

## 2015-03-10 DIAGNOSIS — H35342 Macular cyst, hole, or pseudohole, left eye: Secondary | ICD-10-CM | POA: Diagnosis not present

## 2015-04-15 DIAGNOSIS — L859 Epidermal thickening, unspecified: Secondary | ICD-10-CM | POA: Diagnosis not present

## 2015-04-15 DIAGNOSIS — L821 Other seborrheic keratosis: Secondary | ICD-10-CM | POA: Diagnosis not present

## 2015-04-15 DIAGNOSIS — D1801 Hemangioma of skin and subcutaneous tissue: Secondary | ICD-10-CM | POA: Diagnosis not present

## 2015-05-12 DIAGNOSIS — Z09 Encounter for follow-up examination after completed treatment for conditions other than malignant neoplasm: Secondary | ICD-10-CM | POA: Diagnosis not present

## 2015-05-12 DIAGNOSIS — H35342 Macular cyst, hole, or pseudohole, left eye: Secondary | ICD-10-CM | POA: Diagnosis not present

## 2015-05-23 ENCOUNTER — Emergency Department (HOSPITAL_BASED_OUTPATIENT_CLINIC_OR_DEPARTMENT_OTHER)
Admission: EM | Admit: 2015-05-23 | Discharge: 2015-05-23 | Disposition: A | Discharge status: Home / Self Care | Payer: Commercial Managed Care - HMO | Attending: Emergency Medicine | Admitting: Emergency Medicine

## 2015-05-23 ENCOUNTER — Encounter (HOSPITAL_BASED_OUTPATIENT_CLINIC_OR_DEPARTMENT_OTHER): Payer: Self-pay | Admitting: *Deleted

## 2015-05-23 DIAGNOSIS — Z79899 Other long term (current) drug therapy: Secondary | ICD-10-CM | POA: Diagnosis not present

## 2015-05-23 DIAGNOSIS — S30860A Insect bite (nonvenomous) of lower back and pelvis, initial encounter: Secondary | ICD-10-CM | POA: Diagnosis not present

## 2015-05-23 DIAGNOSIS — Z7984 Long term (current) use of oral hypoglycemic drugs: Secondary | ICD-10-CM | POA: Insufficient documentation

## 2015-05-23 DIAGNOSIS — J45909 Unspecified asthma, uncomplicated: Secondary | ICD-10-CM | POA: Insufficient documentation

## 2015-05-23 DIAGNOSIS — Y9389 Activity, other specified: Secondary | ICD-10-CM | POA: Insufficient documentation

## 2015-05-23 DIAGNOSIS — Y998 Other external cause status: Secondary | ICD-10-CM | POA: Insufficient documentation

## 2015-05-23 DIAGNOSIS — W57XXXA Bitten or stung by nonvenomous insect and other nonvenomous arthropods, initial encounter: Secondary | ICD-10-CM | POA: Diagnosis not present

## 2015-05-23 DIAGNOSIS — Z7982 Long term (current) use of aspirin: Secondary | ICD-10-CM | POA: Diagnosis not present

## 2015-05-23 DIAGNOSIS — Y9289 Other specified places as the place of occurrence of the external cause: Secondary | ICD-10-CM | POA: Diagnosis not present

## 2015-05-23 DIAGNOSIS — S3992XA Unspecified injury of lower back, initial encounter: Secondary | ICD-10-CM | POA: Diagnosis present

## 2015-05-23 DIAGNOSIS — S21251A Open bite of right back wall of thorax without penetration into thoracic cavity, initial encounter: Secondary | ICD-10-CM | POA: Diagnosis not present

## 2015-05-23 DIAGNOSIS — S40261A Insect bite (nonvenomous) of right shoulder, initial encounter: Secondary | ICD-10-CM | POA: Insufficient documentation

## 2015-05-23 DIAGNOSIS — E119 Type 2 diabetes mellitus without complications: Secondary | ICD-10-CM | POA: Diagnosis not present

## 2015-05-23 MED ORDER — DOXYCYCLINE HYCLATE 100 MG PO CAPS: 100.0000 mg | ORAL_CAPSULE | Freq: Two times a day (BID) | ORAL | Status: DC

## 2015-05-23 NOTE — Discharge Instructions (Signed)
Return without fail for worsening symptoms, including fever, headache, flu-like symptoms, rash, or any other symptoms concerning to you.  Tick Bite Information Ticks are insects that attach themselves to the skin and draw blood for food. There are various types of ticks. Common types include wood ticks and deer ticks. Most ticks live in shrubs and grassy areas. Ticks can climb onto your body when you make contact with leaves or grass where the tick is waiting. The most common places on the body for ticks to attach themselves are the scalp, neck, armpits, waist, and groin. Most tick bites are harmless, but sometimes ticks carry germs that cause diseases. These germs can be spread to a person during the tick's feeding process. The chance of a disease spreading through a tick bite depends on:   The type of tick.  Time of year.   How long the tick is attached.   Geographic location.  HOW CAN YOU PREVENT TICK BITES? Take these steps to help prevent tick bites when you are outdoors:  Wear protective clothing. Long sleeves and long pants are best.   Wear white clothes so you can see ticks more easily.  Tuck your pant legs into your socks.   If walking on a trail, stay in the middle of the trail to avoid brushing against bushes.  Avoid walking through areas with long grass.  Put insect repellent on all exposed skin and along boot tops, pant legs, and sleeve cuffs.   Check clothing, hair, and skin repeatedly and before going inside.   Brush off any ticks that are not attached.  Take a shower or bath as soon as possible after being outdoors.  WHAT IS THE PROPER WAY TO REMOVE A TICK? Ticks should be removed as soon as possible to help prevent diseases caused by tick bites. 1. If latex gloves are available, put them on before trying to remove a tick.  2. Using fine-point tweezers, grasp the tick as close to the skin as possible. You may also use curved forceps or a tick removal  tool. Grasp the tick as close to its head as possible. Avoid grasping the tick on its body. 3. Pull gently with steady upward pressure until the tick lets go. Do not twist the tick or jerk it suddenly. This may break off the tick's head or mouth parts. 4. Do not squeeze or crush the tick's body. This could force disease-carrying fluids from the tick into your body.  5. After the tick is removed, wash the bite area and your hands with soap and water or other disinfectant such as alcohol. 6. Apply a small amount of antiseptic cream or ointment to the bite site.  7. Wash and disinfect any instruments that were used.  Do not try to remove a tick by applying a hot match, petroleum jelly, or fingernail polish to the tick. These methods do not work and may increase the chances of disease being spread from the tick bite.  WHEN SHOULD YOU SEEK MEDICAL CARE? Contact your health care provider if you are unable to remove a tick from your skin or if a part of the tick breaks off and is stuck in the skin.  After a tick bite, you need to be aware of signs and symptoms that could be related to diseases spread by ticks. Contact your health care provider if you develop any of the following in the days or weeks after the tick bite:  Unexplained fever.  Rash. A circular rash  that appears days or weeks after the tick bite may indicate the possibility of Lyme disease. The rash may resemble a target with a bull's-eye and may occur at a different part of your body than the tick bite.  Redness and swelling in the area of the tick bite.   Tender, swollen lymph glands.   Diarrhea.   Weight loss.   Cough.   Fatigue.   Muscle, joint, or bone pain.   Abdominal pain.   Headache.   Lethargy or a change in your level of consciousness.  Difficulty walking or moving your legs.   Numbness in the legs.   Paralysis.  Shortness of breath.   Confusion.   Repeated vomiting.    This information  is not intended to replace advice given to you by your health care provider. Make sure you discuss any questions you have with your health care provider.   Document Released: 03/03/2000 Document Revised: 03/27/2014 Document Reviewed: 08/14/2012 Elsevier Interactive Patient Education [employer].

## 2015-05-23 NOTE — ED Notes (Signed)
States has a tick at left scapula area, noted this am

## 2015-05-23 NOTE — ED Notes (Signed)
Area noted at left scapula area to be red and appears a small tick is in place

## 2015-05-23 NOTE — ED Notes (Signed)
DC instructions and Rx reviewed with pt, opportunity for questions provided, pt teaching done on wound care

## 2015-05-23 NOTE — ED Provider Notes (Signed)
CSN: [id number]     Arrival date & time 05/23/15  [id number] History   First MD Initiated Contact with Patient 05/23/15 [phone]     Chief Complaint  Patient presents with  . Tick Removal     (Consider location/radiation/quality/duration/timing/severity/associated sxs/prior Treatment) HPI 75 year old female who presents with tick bite. History of asthma and DM. Was in her garden/yard on Tuesday (last outside exposure) and noticed soreness on her back over past few days. Identified tick bite and came to ED. denies fever, chills, myalgias, headache, rash, or other recent illness. No other tick bites   Past Medical History  Diagnosis Date  . Tachycardia     no problems now.  . Asthma   . Complication of anesthesia     can not have novacain  . Diabetes mellitus     A1C < 7   Past Surgical History  Procedure Laterality Date  . Uterine cyst    . Tubal ligation    . Hand surgery    . Breast lumpectomy    . Foot surgery     Family History  Problem Relation Age of Onset  . Stroke Neg Hx   . Heart failure Father   . Lymphoma Brother    Social History  Substance Use Topics  . Smoking status: Never Smoker   . Smokeless tobacco: Never Used  . Alcohol Use: Yes     Comment: occassionally   OB History    No data available     Review of Systems  Constitutional: Negative for fever.  HENT: Negative for congestion.   Respiratory: Negative for shortness of breath.   Gastrointestinal: Negative for nausea, vomiting, abdominal pain and diarrhea.  Genitourinary: Negative for difficulty urinating.  Musculoskeletal: Positive for back pain.  Allergic/Immunologic: Negative for immunocompromised state.  Neurological: Negative for headaches.  Hematological: Does not bruise/bleed easily.  Psychiatric/Behavioral: Negative for confusion.  All other systems reviewed and are negative.     Allergies  Other; Shellfish allergy; Strawberry extract; and Yellow dyes (non-tartrazine)  Home Medications    Prior to Admission medications   Medication Sig Start Date End Date Taking? Authorizing Provider  acetaminophen (TYLENOL) 500 MG tablet Take 500 mg by mouth daily as needed for headache.    Historical Provider, MD  aspirin 81 MG tablet Take 1 tablet (81 mg total) by mouth daily. 08/14/12   [name], NP  atorvastatin (LIPITOR) 20 MG tablet Take 20 mg by mouth daily.    Historical Provider, MD  doxycycline (VIBRAMYCIN) 100 MG capsule Take 1 capsule (100 mg total) by mouth 2 (two) times daily. 05/23/15   [name], MD  glimepiride (AMARYL) 2 MG tablet Take 2 mg by mouth daily before breakfast.    Historical Provider, MD  metFORMIN (GLUCOPHAGE) 500 MG tablet Take 1,000 mg by mouth 2 (two) times daily with a meal.    Historical Provider, MD  PROAIR HFA 108 (90 BASE) MCG/ACT inhaler Inhale 2 puffs into the lungs every 4 (four) hours as needed.  10/07/12   Historical Provider, MD   BP 172/80 mmHg  Pulse 74  Temp(Src) 98.5 F (36.9 C) (Oral)  Resp 18  Ht 5\' 6"  (1.676 m)  Wt 170 lb (77.111 kg)  BMI 27.45 kg/m2  SpO2 98% Physical Exam Physical Exam  Nursing note and vitals reviewed. Constitutional: Well developed, well nourished, non-toxic, and in no acute distress Head: Normocephalic and atraumatic.  Mouth/Throat: Oropharynx is clear and moist.  Neck: Normal range of  motion. Neck supple.  Cardiovascular: Normal rate and regular rhythm.   Pulmonary/Chest: Effort normal  Abdominal: Soft. Nondistended Musculoskeletal: Tick and tick bite over right scapula.  Neurological: Alert, no facial droop, fluent speech, moves all extremities symmetrically Skin: Skin is warm and dry.  Psychiatric: Cooperative   ED Course  Procedures (including critical care time) Labs Review Labs Reviewed - No data to display  Imaging Review No results found. I have personally reviewed and evaluated these images and lab results as part of my medical decision-making.   EKG Interpretation None      MDM    Final diagnoses:  Tick bite of back, initial encounter  Tick bite with subsequent removal of tick    Presenting with tick bite. Well appearing, no other symptoms. Tick removed. > 36 hours, and will ppx with doxycycline. Strict return and follow-up instructions reviewed. She expressed understanding of all discharge instructions and felt comfortable with the plan of care.     [name], MD 05/23/15 [phone]

## 2015-06-14 DIAGNOSIS — H524 Presbyopia: Secondary | ICD-10-CM | POA: Diagnosis not present

## 2015-06-14 DIAGNOSIS — H5210 Myopia, unspecified eye: Secondary | ICD-10-CM | POA: Diagnosis not present

## 2015-06-21 DIAGNOSIS — R238 Other skin changes: Secondary | ICD-10-CM | POA: Diagnosis not present

## 2015-06-21 DIAGNOSIS — R0789 Other chest pain: Secondary | ICD-10-CM | POA: Diagnosis not present

## 2015-06-21 DIAGNOSIS — M898X1 Other specified disorders of bone, shoulder: Secondary | ICD-10-CM | POA: Diagnosis not present

## 2015-06-21 DIAGNOSIS — H578 Other specified disorders of eye and adnexa: Secondary | ICD-10-CM | POA: Diagnosis not present

## 2015-06-24 DIAGNOSIS — H10502 Unspecified blepharoconjunctivitis, left eye: Secondary | ICD-10-CM | POA: Diagnosis not present

## 2015-07-27 DIAGNOSIS — E1142 Type 2 diabetes mellitus with diabetic polyneuropathy: Secondary | ICD-10-CM | POA: Diagnosis not present

## 2015-07-27 DIAGNOSIS — Z7984 Long term (current) use of oral hypoglycemic drugs: Secondary | ICD-10-CM | POA: Diagnosis not present

## 2016-03-02 DIAGNOSIS — Z0000 Encounter for general adult medical examination without abnormal findings: Secondary | ICD-10-CM | POA: Diagnosis not present

## 2016-03-02 DIAGNOSIS — M8580 Other specified disorders of bone density and structure, unspecified site: Secondary | ICD-10-CM | POA: Diagnosis not present

## 2016-03-02 DIAGNOSIS — E7800 Pure hypercholesterolemia, unspecified: Secondary | ICD-10-CM | POA: Diagnosis not present

## 2016-03-02 DIAGNOSIS — I779 Disorder of arteries and arterioles, unspecified: Secondary | ICD-10-CM | POA: Diagnosis not present

## 2016-03-02 DIAGNOSIS — E1142 Type 2 diabetes mellitus with diabetic polyneuropathy: Secondary | ICD-10-CM | POA: Diagnosis not present

## 2016-03-02 DIAGNOSIS — Z7984 Long term (current) use of oral hypoglycemic drugs: Secondary | ICD-10-CM | POA: Diagnosis not present

## 2016-03-23 DIAGNOSIS — N6459 Other signs and symptoms in breast: Secondary | ICD-10-CM | POA: Diagnosis not present

## 2016-05-11 DIAGNOSIS — E113393 Type 2 diabetes mellitus with moderate nonproliferative diabetic retinopathy without macular edema, bilateral: Secondary | ICD-10-CM | POA: Diagnosis not present

## 2016-05-11 DIAGNOSIS — H2512 Age-related nuclear cataract, left eye: Secondary | ICD-10-CM | POA: Diagnosis not present

## 2016-05-11 DIAGNOSIS — H35342 Macular cyst, hole, or pseudohole, left eye: Secondary | ICD-10-CM | POA: Diagnosis not present

## 2016-05-11 DIAGNOSIS — H2511 Age-related nuclear cataract, right eye: Secondary | ICD-10-CM | POA: Diagnosis not present

## 2016-05-11 DIAGNOSIS — H2513 Age-related nuclear cataract, bilateral: Secondary | ICD-10-CM | POA: Diagnosis not present

## 2016-06-28 DIAGNOSIS — L218 Other seborrheic dermatitis: Secondary | ICD-10-CM | POA: Diagnosis not present

## 2016-06-28 DIAGNOSIS — D2262 Melanocytic nevi of left upper limb, including shoulder: Secondary | ICD-10-CM | POA: Diagnosis not present

## 2016-06-28 DIAGNOSIS — D1801 Hemangioma of skin and subcutaneous tissue: Secondary | ICD-10-CM | POA: Diagnosis not present

## 2016-06-28 DIAGNOSIS — D485 Neoplasm of uncertain behavior of skin: Secondary | ICD-10-CM | POA: Diagnosis not present

## 2016-06-28 DIAGNOSIS — L814 Other melanin hyperpigmentation: Secondary | ICD-10-CM | POA: Diagnosis not present

## 2016-06-28 DIAGNOSIS — L821 Other seborrheic keratosis: Secondary | ICD-10-CM | POA: Diagnosis not present

## 2016-07-06 DIAGNOSIS — H2513 Age-related nuclear cataract, bilateral: Secondary | ICD-10-CM | POA: Diagnosis not present

## 2016-07-06 DIAGNOSIS — E119 Type 2 diabetes mellitus without complications: Secondary | ICD-10-CM | POA: Diagnosis not present

## 2016-07-06 DIAGNOSIS — H524 Presbyopia: Secondary | ICD-10-CM | POA: Diagnosis not present

## 2016-07-06 DIAGNOSIS — H35342 Macular cyst, hole, or pseudohole, left eye: Secondary | ICD-10-CM | POA: Diagnosis not present

## 2016-08-22 DIAGNOSIS — R569 Unspecified convulsions: Secondary | ICD-10-CM | POA: Diagnosis not present

## 2016-08-22 DIAGNOSIS — E119 Type 2 diabetes mellitus without complications: Secondary | ICD-10-CM | POA: Diagnosis not present

## 2016-08-22 DIAGNOSIS — R42 Dizziness and giddiness: Secondary | ICD-10-CM | POA: Diagnosis not present

## 2016-08-22 DIAGNOSIS — R55 Syncope and collapse: Secondary | ICD-10-CM | POA: Diagnosis not present

## 2016-08-22 DIAGNOSIS — R110 Nausea: Secondary | ICD-10-CM | POA: Diagnosis not present

## 2016-08-22 DIAGNOSIS — R918 Other nonspecific abnormal finding of lung field: Secondary | ICD-10-CM | POA: Diagnosis not present

## 2016-08-22 DIAGNOSIS — J189 Pneumonia, unspecified organism: Secondary | ICD-10-CM | POA: Diagnosis not present

## 2016-08-22 DIAGNOSIS — Z7984 Long term (current) use of oral hypoglycemic drugs: Secondary | ICD-10-CM | POA: Diagnosis not present

## 2016-08-28 DIAGNOSIS — J189 Pneumonia, unspecified organism: Secondary | ICD-10-CM | POA: Diagnosis not present

## 2016-08-28 DIAGNOSIS — R0789 Other chest pain: Secondary | ICD-10-CM | POA: Diagnosis not present

## 2016-08-28 DIAGNOSIS — R42 Dizziness and giddiness: Secondary | ICD-10-CM | POA: Diagnosis not present

## 2016-09-22 DIAGNOSIS — R0781 Pleurodynia: Secondary | ICD-10-CM | POA: Diagnosis not present

## 2016-09-27 DIAGNOSIS — R109 Unspecified abdominal pain: Secondary | ICD-10-CM | POA: Diagnosis not present

## 2016-09-27 DIAGNOSIS — R079 Chest pain, unspecified: Secondary | ICD-10-CM | POA: Diagnosis not present

## 2016-10-02 ENCOUNTER — Emergency Department (HOSPITAL_BASED_OUTPATIENT_CLINIC_OR_DEPARTMENT_OTHER): Payer: Medicare HMO

## 2016-10-02 ENCOUNTER — Emergency Department (HOSPITAL_BASED_OUTPATIENT_CLINIC_OR_DEPARTMENT_OTHER)
Admission: EM | Admit: 2016-10-02 | Discharge: 2016-10-02 | Disposition: A | Discharge status: Home / Self Care | Payer: Medicare HMO | Attending: Emergency Medicine | Admitting: Emergency Medicine

## 2016-10-02 ENCOUNTER — Encounter (HOSPITAL_BASED_OUTPATIENT_CLINIC_OR_DEPARTMENT_OTHER): Payer: Self-pay | Admitting: *Deleted

## 2016-10-02 DIAGNOSIS — Z8673 Personal history of transient ischemic attack (TIA), and cerebral infarction without residual deficits: Secondary | ICD-10-CM | POA: Diagnosis not present

## 2016-10-02 DIAGNOSIS — E119 Type 2 diabetes mellitus without complications: Secondary | ICD-10-CM | POA: Insufficient documentation

## 2016-10-02 DIAGNOSIS — Z79899 Other long term (current) drug therapy: Secondary | ICD-10-CM | POA: Diagnosis not present

## 2016-10-02 DIAGNOSIS — J45909 Unspecified asthma, uncomplicated: Secondary | ICD-10-CM | POA: Diagnosis not present

## 2016-10-02 DIAGNOSIS — Z7984 Long term (current) use of oral hypoglycemic drugs: Secondary | ICD-10-CM | POA: Diagnosis not present

## 2016-10-02 DIAGNOSIS — G8929 Other chronic pain: Secondary | ICD-10-CM | POA: Diagnosis not present

## 2016-10-02 DIAGNOSIS — R079 Chest pain, unspecified: Secondary | ICD-10-CM | POA: Diagnosis not present

## 2016-10-02 DIAGNOSIS — Z7982 Long term (current) use of aspirin: Secondary | ICD-10-CM | POA: Diagnosis not present

## 2016-10-02 LAB — CBC WITH DIFFERENTIAL/PLATELET
Basophils Absolute: 0 10*3/uL (ref 0.0–0.1)
Basophils Relative: 0 %
EOS ABS: 0.1 10*3/uL (ref 0.0–0.7)
Eosinophils Relative: 2 %
HEMATOCRIT: 38.9 % (ref 36.0–46.0)
Hemoglobin: 13.2 g/dL (ref 12.0–15.0)
Lymphocytes Relative: 37 %
Lymphs Abs: 2.1 10*3/uL (ref 0.7–4.0)
MCH: 29.4 pg (ref 26.0–34.0)
MCHC: 33.9 g/dL (ref 30.0–36.0)
MCV: 86.6 fL (ref 78.0–100.0)
MONO ABS: 0.3 10*3/uL (ref 0.1–1.0)
Monocytes Relative: 6 %
NEUTROS ABS: 3.1 10*3/uL (ref 1.7–7.7)
Neutrophils Relative %: 55 %
Platelets: 152 10*3/uL (ref 150–400)
RBC: 4.49 MIL/uL (ref 3.87–5.11)
RDW: 13.2 % (ref 11.5–15.5)
WBC: 5.6 10*3/uL (ref 4.0–10.5)

## 2016-10-02 LAB — COMPREHENSIVE METABOLIC PANEL
ALBUMIN: 4 g/dL (ref 3.5–5.0)
ALT: 18 U/L (ref 14–54)
AST: 21 U/L (ref 15–41)
Alkaline Phosphatase: 68 U/L (ref 38–126)
Anion gap: 9 (ref 5–15)
BILIRUBIN TOTAL: 0.3 mg/dL (ref 0.3–1.2)
BUN: 14 mg/dL (ref 6–20)
CO2: 27 mmol/L (ref 22–32)
Calcium: 9.2 mg/dL (ref 8.9–10.3)
Chloride: 99 mmol/L — ABNORMAL LOW (ref 101–111)
Creatinine, Ser: 0.6 mg/dL (ref 0.44–1.00)
GFR calc Af Amer: >60 mL/min (ref >60)
GFR calc non Af Amer: >60 mL/min (ref >60)
GLUCOSE: 209 mg/dL — AB (ref 65–99)
Potassium: 4.2 mmol/L (ref 3.5–5.1)
Sodium: 135 mmol/L (ref 135–145)
TOTAL PROTEIN: 7.1 g/dL (ref 6.5–8.1)

## 2016-10-02 LAB — TROPONIN I

## 2016-10-02 MED ORDER — IOPAMIDOL (ISOVUE-370) INJECTION 76%
100.0000 mL | Freq: Once | INTRAVENOUS | Status: AC | PRN
  Administered 2016-10-02: 100 mL via INTRAVENOUS

## 2016-10-02 NOTE — ED Triage Notes (Addendum)
Pain in her left scapula radiating under her anterior breast x 6 months. Pain in her right ribs. No injury. Her MD is aware. She hopes to have a CT scan of her chest today to have the results on her chart for her MD when he gets back from vacation. She has had negative EKG and negative CT of her head.

## 2016-10-02 NOTE — Discharge Instructions (Signed)
It was our pleasure to provide your ER care today - we hope that you feel better.  Take your pain medication as need.   Follow up with Dr Harrington Challenger in the coming week.   Also follow up with him regarding your blood sugar (209) and blood pressure, which are high today.  Return to ER if worse, new symptoms, fevers, trouble breathing, other concern.

## 2016-10-02 NOTE — ED Provider Notes (Signed)
[place] [facility] Provider Note   CSN: [id number] Arrival date & time: 10/02/16  1306   By signing my name below, I, Anna Norris, attest that this documentation has been prepared under the direction and in the presence of [name], MD. Electronically Signed: [place], ED Scribe. 10/02/16. 4:29 PM.  History   Chief Complaint Chief Complaint  Patient presents with  . Chest Pain    HPI Anna Norris is a 76 y.o. female with a PMHx of DM, who presents to the Emergency Department complaining of constant, left sided scapula pain onset 6 months. Pt left scapula pain radiates to underneath her left breast with no modifying factors. Pt reports associated right flank pain since taking mobic and cough x 8 months after inhaling smoke. Pt has tried Rx 5-235 mg hydrocodone with her last dose being at 8:30 AM this morning and gel pain patches with relief of her symptoms. She notes that she has been evaluated by her PCP and multiple specialists with no relief in the treatments given. Pt reports that she has a wellness appointment with [name] next week for a CT scan of the chest and abdomen. Denies PMHx of gallstones or gallbladder issues. Denies hx of kidney issues. She denies SOB, heartburn, sore throat, leg swelling, recent fall, and any other symptoms.     The history is provided by the patient and a relative. No language interpreter was used.    Past Medical History:  Diagnosis Date  . Asthma   . Complication of anesthesia    can not have novacain  . Diabetes mellitus    A1C < 7  . Tachycardia    no problems now.    Patient Active Problem List   Diagnosis Date Noted  . Headache 05/08/2013  . Aphasia 05/08/2013  . Other and unspecified hyperlipidemia 08/14/2012  . TIA (transient ischemic attack) 04/26/2012  . DM2 (diabetes mellitus, type 2) ([place]) 04/26/2012  . Altered mental status 04/26/2012    Past Surgical History:  Procedure Laterality Date  . BREAST  LUMPECTOMY    . FOOT SURGERY    . HAND SURGERY    . TUBAL LIGATION    . uterine cyst      OB History    No data available       Home Medications    Prior to Admission medications   Medication Sig Start Date End Date Taking? Authorizing Provider  acetaminophen (TYLENOL) 500 MG tablet Take 500 mg by mouth daily as needed for headache.   Yes [provider]  glimepiride (AMARYL) 2 MG tablet Take 2 mg by mouth daily before breakfast.   Yes [provider]  metFORMIN (GLUCOPHAGE) 500 MG tablet Take 1,000 mg by mouth 2 (two) times daily with a meal.   Yes [provider]  PROAIR HFA 108 (90 BASE) MCG/ACT inhaler Inhale 2 puffs into the lungs every 4 (four) hours as needed.  10/07/12  Yes [provider]  aspirin 81 MG tablet Take 1 tablet (81 mg total) by mouth daily. 08/14/12   [name], NP  atorvastatin (LIPITOR) 20 MG tablet Take 20 mg by mouth daily.    [provider]  doxycycline (VIBRAMYCIN) 100 MG capsule Take 1 capsule (100 mg total) by mouth 2 (two) times daily. 05/23/15   [name], MD    Family History Family History  Problem Relation Age of Onset  . Heart failure Father   . Lymphoma Brother   .  Stroke Neg Hx     Social History Social History  Substance Use Topics  . Smoking status: Never Smoker  . Smokeless tobacco: Never Used  . Alcohol use Yes     Comment: occassionally     Allergies   Other; Shellfish allergy; Strawberry extract; and Yellow dyes (non-tartrazine)   Review of Systems Review of Systems  HENT: Negative for sore throat.   Respiratory: Positive for cough. Negative for shortness of breath.   Cardiovascular: Positive for chest pain (left sided). Negative for leg swelling.  Genitourinary: Positive for flank pain (right).     Physical Exam Updated Vital Signs BP (!) 154/80 (BP Location: Right Arm)   Pulse 72   Temp 98.2 F (36.8 C) (Oral)   Resp 18   Ht 5\' 6"  (1.676 m)   Wt 170 lb (77.1  kg)   SpO2 97%   BMI 27.44 kg/m   Physical Exam  Constitutional: She is oriented to person, place, and time. She appears well-developed and well-nourished. No distress.  HENT:  Head: Normocephalic and atraumatic.  Eyes: EOM are normal.  Neck: Neck supple.  Cardiovascular: Normal rate, regular rhythm and normal heart sounds.  Exam reveals no gallop and no friction rub.   No murmur heard. Pulmonary/Chest: Effort normal and breath sounds normal. No respiratory distress. She has no wheezes. She has no rales. She exhibits no tenderness.  Abdominal: Soft. She exhibits no distension. There is no tenderness.  Musculoskeletal: Normal range of motion.  Neurological: She is alert and oriented to person, place, and time.  Skin: Skin is warm and dry.  Psychiatric: She has a normal mood and affect. Her behavior is normal.  Nursing note and vitals reviewed.    ED Treatments / Results  DIAGNOSTIC STUDIES: Oxygen Saturation is 97% on RA, nl by my interpretation.    COORDINATION OF CARE: 3:38 PM Discussed treatment plan with pt at bedside and pt agreed to plan.   Labs (all labs ordered are listed, but only abnormal results are displayed) Results for orders placed or performed during the hospital encounter of 10/02/16  CBC with Differential/Platelet  Result Value Ref Range   WBC 5.6 4.0 - 10.5 K/uL   RBC 4.49 3.87 - 5.11 MIL/uL   Hemoglobin 13.2 12.0 - 15.0 g/dL   HCT 38.9 36.0 - 46.0 %   MCV 86.6 78.0 - 100.0 fL   MCH 29.4 26.0 - 34.0 pg   MCHC 33.9 30.0 - 36.0 g/dL   RDW 13.2 11.5 - 15.5 %   Platelets 152 150 - 400 K/uL   Neutrophils Relative % 55 %   Neutro Abs 3.1 1.7 - 7.7 K/uL   Lymphocytes Relative 37 %   Lymphs Abs 2.1 0.7 - 4.0 K/uL   Monocytes Relative 6 %   Monocytes Absolute 0.3 0.1 - 1.0 K/uL   Eosinophils Relative 2 %   Eosinophils Absolute 0.1 0.0 - 0.7 K/uL   Basophils Relative 0 %   Basophils Absolute 0.0 0.0 - 0.1 K/uL  Comprehensive metabolic panel  Result  Value Ref Range   Sodium 135 135 - 145 mmol/L   Potassium 4.2 3.5 - 5.1 mmol/L   Chloride 99 (L) 101 - 111 mmol/L   CO2 27 22 - 32 mmol/L   Glucose, Bld 209 (H) 65 - 99 mg/dL   BUN 14 6 - 20 mg/dL   Creatinine, Ser 0.60 0.44 - 1.00 mg/dL   Calcium 9.2 8.9 - 10.3 mg/dL   Total Protein 7.1 6.5 -  8.1 g/dL   Albumin 4.0 3.5 - 5.0 g/dL   AST 21 15 - 41 U/L   ALT 18 14 - 54 U/L   Alkaline Phosphatase 68 38 - 126 U/L   Total Bilirubin 0.3 0.3 - 1.2 mg/dL   GFR calc non Af Amer >60 >60 mL/min   GFR calc Af Amer >60 >60 mL/min   Anion gap 9 5 - 15  Troponin I  Result Value Ref Range   Troponin I <0.03 <0.03 ng/mL   Ct Angio Chest Pe W And/or Wo Contrast  Result Date: 10/02/2016 CLINICAL DATA:  Chest pain EXAM: CT ANGIOGRAPHY CHEST WITH CONTRAST TECHNIQUE: Multidetector CT imaging of the chest was performed using the standard protocol during bolus administration of intravenous contrast. Multiplanar CT image reconstructions and MIPs were obtained to evaluate the vascular anatomy. CONTRAST:  100 mL Isovue 370 COMPARISON:  Chest radiograph 09/22/2016 FINDINGS: Cardiovascular: Contrast injection is sufficient to demonstrate satisfactory opacification of the pulmonary arteries to the segmental level. There is no pulmonary embolus. The main pulmonary artery is within normal limits for size. There is no CT evidence of acute right heart strain. There is atherosclerotic calcification of the aortic arch. There is variant arch anatomy with an independent origin of the left vertebral artery. Heart size is normal, without pericardial effusion. Mediastinum/Nodes: No mediastinal, hilar or axillary lymphadenopathy. The visualized thyroid and thoracic esophageal course are unremarkable. Lungs/Pleura: No pulmonary nodules or masses. No pleural effusion or pneumothorax. No focal airspace consolidation. No focal pleural abnormality. Upper Abdomen: Contrast bolus timing is not optimized for evaluation of the abdominal  organs. Within this limitation, the visualized organs of the upper abdomen are normal. Musculoskeletal: No chest wall abnormality. No acute or significant osseous findings. Review of the MIP images confirms the above findings. IMPRESSION: 1. No pulmonary embolus. 2. No acute abnormality of the chest. 3.  Aortic Atherosclerosis (ICD10-I70.0). Electronically Signed   By: Ulyses Jarred M.D.   On: 10/02/2016 16:57    EKG  EKG Interpretation  Date/Time:  Monday October 02 2016 14:27:57 EDT Ventricular Rate:  66 PR Interval:  164 QRS Duration: 82 QT Interval:  386 QTC Calculation: 404 R Axis:   -3 Text Interpretation:  Normal sinus rhythm Nonspecific ST and T wave abnormality Abnormal ECG Confirmed by Lita Mains  MD, DAVID ([id number]) on 10/02/2016 2:57:17 PM       Radiology Ct Angio Chest Pe W And/or Wo Contrast  Result Date: 10/02/2016 CLINICAL DATA:  Chest pain EXAM: CT ANGIOGRAPHY CHEST WITH CONTRAST TECHNIQUE: Multidetector CT imaging of the chest was performed using the standard protocol during bolus administration of intravenous contrast. Multiplanar CT image reconstructions and MIPs were obtained to evaluate the vascular anatomy. CONTRAST:  100 mL Isovue 370 COMPARISON:  Chest radiograph 09/22/2016 FINDINGS: Cardiovascular: Contrast injection is sufficient to demonstrate satisfactory opacification of the pulmonary arteries to the segmental level. There is no pulmonary embolus. The main pulmonary artery is within normal limits for size. There is no CT evidence of acute right heart strain. There is atherosclerotic calcification of the aortic arch. There is variant arch anatomy with an independent origin of the left vertebral artery. Heart size is normal, without pericardial effusion. Mediastinum/Nodes: No mediastinal, hilar or axillary lymphadenopathy. The visualized thyroid and thoracic esophageal course are unremarkable. Lungs/Pleura: No pulmonary nodules or masses. No pleural effusion or pneumothorax.  No focal airspace consolidation. No focal pleural abnormality. Upper Abdomen: Contrast bolus timing is not optimized for evaluation of the abdominal organs. Within this  limitation, the visualized organs of the upper abdomen are normal. Musculoskeletal: No chest wall abnormality. No acute or significant osseous findings. Review of the MIP images confirms the above findings. IMPRESSION: 1. No pulmonary embolus. 2. No acute abnormality of the chest. 3.  Aortic Atherosclerosis (ICD10-I70.0). Electronically Signed   By: Ulyses Jarred M.D.   On: 10/02/2016 16:57    Procedures Procedures (including critical care time)  Medications Ordered in ED Medications - No data to display   Initial Impression / Assessment and Plan / ED Course  I have reviewed the triage vital signs and the nursing notes.  Pertinent labs & imaging results that were available during my care of the patient were reviewed by me and considered in my medical decision making (see chart for details).  Iv ns. Labs.  Patient request ct chest - states her pcp had planned on doing next week.   Pt took pain med prior to arrival, and declines additional pain med currently.  Ct neg acute.  Labs unremarkable except mild-mod elev glucose.   Symptoms ongoing x 5-6 months without abrupt worsening today - rec pcp f/u.     Final Clinical Impressions(s) / ED Diagnoses   Final diagnoses:  None    New Prescriptions New Prescriptions   No medications on file   I personally performed the services described in this documentation, which was scribed in my presence. The recorded information has been reviewed and considered. [name], MD     [name], MD 10/02/16 [phone]

## 2016-10-05 DIAGNOSIS — R42 Dizziness and giddiness: Secondary | ICD-10-CM | POA: Diagnosis not present

## 2016-10-05 DIAGNOSIS — Z7984 Long term (current) use of oral hypoglycemic drugs: Secondary | ICD-10-CM | POA: Diagnosis not present

## 2016-10-05 DIAGNOSIS — E1142 Type 2 diabetes mellitus with diabetic polyneuropathy: Secondary | ICD-10-CM | POA: Diagnosis not present

## 2016-10-05 DIAGNOSIS — M940 Chondrocostal junction syndrome [Tietze]: Secondary | ICD-10-CM | POA: Diagnosis not present

## 2016-10-17 DIAGNOSIS — D485 Neoplasm of uncertain behavior of skin: Secondary | ICD-10-CM | POA: Diagnosis not present

## 2016-10-17 DIAGNOSIS — L905 Scar conditions and fibrosis of skin: Secondary | ICD-10-CM | POA: Diagnosis not present

## 2016-11-07 DIAGNOSIS — M940 Chondrocostal junction syndrome [Tietze]: Secondary | ICD-10-CM | POA: Diagnosis not present

## 2016-11-13 DIAGNOSIS — M9902 Segmental and somatic dysfunction of thoracic region: Secondary | ICD-10-CM | POA: Diagnosis not present

## 2016-11-13 DIAGNOSIS — M62830 Muscle spasm of back: Secondary | ICD-10-CM | POA: Diagnosis not present

## 2016-11-13 DIAGNOSIS — M9901 Segmental and somatic dysfunction of cervical region: Secondary | ICD-10-CM | POA: Diagnosis not present

## 2016-11-13 DIAGNOSIS — M546 Pain in thoracic spine: Secondary | ICD-10-CM | POA: Diagnosis not present

## 2016-11-14 DIAGNOSIS — M9901 Segmental and somatic dysfunction of cervical region: Secondary | ICD-10-CM | POA: Diagnosis not present

## 2016-11-14 DIAGNOSIS — M9902 Segmental and somatic dysfunction of thoracic region: Secondary | ICD-10-CM | POA: Diagnosis not present

## 2016-11-14 DIAGNOSIS — M546 Pain in thoracic spine: Secondary | ICD-10-CM | POA: Diagnosis not present

## 2016-11-14 DIAGNOSIS — M62830 Muscle spasm of back: Secondary | ICD-10-CM | POA: Diagnosis not present

## 2016-11-16 DIAGNOSIS — M62830 Muscle spasm of back: Secondary | ICD-10-CM | POA: Diagnosis not present

## 2016-11-16 DIAGNOSIS — M546 Pain in thoracic spine: Secondary | ICD-10-CM | POA: Diagnosis not present

## 2016-11-16 DIAGNOSIS — M9902 Segmental and somatic dysfunction of thoracic region: Secondary | ICD-10-CM | POA: Diagnosis not present

## 2016-11-16 DIAGNOSIS — M9901 Segmental and somatic dysfunction of cervical region: Secondary | ICD-10-CM | POA: Diagnosis not present

## 2016-11-17 DIAGNOSIS — M9901 Segmental and somatic dysfunction of cervical region: Secondary | ICD-10-CM | POA: Diagnosis not present

## 2016-11-17 DIAGNOSIS — M62830 Muscle spasm of back: Secondary | ICD-10-CM | POA: Diagnosis not present

## 2016-11-17 DIAGNOSIS — M9902 Segmental and somatic dysfunction of thoracic region: Secondary | ICD-10-CM | POA: Diagnosis not present

## 2016-11-17 DIAGNOSIS — M546 Pain in thoracic spine: Secondary | ICD-10-CM | POA: Diagnosis not present

## 2016-11-21 DIAGNOSIS — M9902 Segmental and somatic dysfunction of thoracic region: Secondary | ICD-10-CM | POA: Diagnosis not present

## 2016-11-21 DIAGNOSIS — M9901 Segmental and somatic dysfunction of cervical region: Secondary | ICD-10-CM | POA: Diagnosis not present

## 2016-11-21 DIAGNOSIS — M62830 Muscle spasm of back: Secondary | ICD-10-CM | POA: Diagnosis not present

## 2016-11-21 DIAGNOSIS — M546 Pain in thoracic spine: Secondary | ICD-10-CM | POA: Diagnosis not present

## 2016-11-23 DIAGNOSIS — M9902 Segmental and somatic dysfunction of thoracic region: Secondary | ICD-10-CM | POA: Diagnosis not present

## 2016-11-23 DIAGNOSIS — M546 Pain in thoracic spine: Secondary | ICD-10-CM | POA: Diagnosis not present

## 2016-11-23 DIAGNOSIS — M62830 Muscle spasm of back: Secondary | ICD-10-CM | POA: Diagnosis not present

## 2016-11-23 DIAGNOSIS — M9901 Segmental and somatic dysfunction of cervical region: Secondary | ICD-10-CM | POA: Diagnosis not present

## 2016-11-25 DIAGNOSIS — M546 Pain in thoracic spine: Secondary | ICD-10-CM | POA: Diagnosis not present

## 2016-11-25 DIAGNOSIS — M9901 Segmental and somatic dysfunction of cervical region: Secondary | ICD-10-CM | POA: Diagnosis not present

## 2016-11-25 DIAGNOSIS — M9902 Segmental and somatic dysfunction of thoracic region: Secondary | ICD-10-CM | POA: Diagnosis not present

## 2016-11-25 DIAGNOSIS — M62830 Muscle spasm of back: Secondary | ICD-10-CM | POA: Diagnosis not present

## 2016-11-27 DIAGNOSIS — M9902 Segmental and somatic dysfunction of thoracic region: Secondary | ICD-10-CM | POA: Diagnosis not present

## 2016-11-27 DIAGNOSIS — M62830 Muscle spasm of back: Secondary | ICD-10-CM | POA: Diagnosis not present

## 2016-11-27 DIAGNOSIS — M9901 Segmental and somatic dysfunction of cervical region: Secondary | ICD-10-CM | POA: Diagnosis not present

## 2016-11-27 DIAGNOSIS — M546 Pain in thoracic spine: Secondary | ICD-10-CM | POA: Diagnosis not present

## 2016-11-29 DIAGNOSIS — M546 Pain in thoracic spine: Secondary | ICD-10-CM | POA: Diagnosis not present

## 2016-11-29 DIAGNOSIS — M9901 Segmental and somatic dysfunction of cervical region: Secondary | ICD-10-CM | POA: Diagnosis not present

## 2016-11-29 DIAGNOSIS — M9902 Segmental and somatic dysfunction of thoracic region: Secondary | ICD-10-CM | POA: Diagnosis not present

## 2016-11-29 DIAGNOSIS — M62830 Muscle spasm of back: Secondary | ICD-10-CM | POA: Diagnosis not present

## 2016-12-01 DIAGNOSIS — M546 Pain in thoracic spine: Secondary | ICD-10-CM | POA: Diagnosis not present

## 2016-12-01 DIAGNOSIS — M62830 Muscle spasm of back: Secondary | ICD-10-CM | POA: Diagnosis not present

## 2016-12-01 DIAGNOSIS — M9902 Segmental and somatic dysfunction of thoracic region: Secondary | ICD-10-CM | POA: Diagnosis not present

## 2016-12-01 DIAGNOSIS — M9901 Segmental and somatic dysfunction of cervical region: Secondary | ICD-10-CM | POA: Diagnosis not present

## 2016-12-04 DIAGNOSIS — M9902 Segmental and somatic dysfunction of thoracic region: Secondary | ICD-10-CM | POA: Diagnosis not present

## 2016-12-04 DIAGNOSIS — M546 Pain in thoracic spine: Secondary | ICD-10-CM | POA: Diagnosis not present

## 2016-12-04 DIAGNOSIS — M62830 Muscle spasm of back: Secondary | ICD-10-CM | POA: Diagnosis not present

## 2016-12-04 DIAGNOSIS — M9901 Segmental and somatic dysfunction of cervical region: Secondary | ICD-10-CM | POA: Diagnosis not present

## 2016-12-05 DIAGNOSIS — M9901 Segmental and somatic dysfunction of cervical region: Secondary | ICD-10-CM | POA: Diagnosis not present

## 2016-12-05 DIAGNOSIS — M9902 Segmental and somatic dysfunction of thoracic region: Secondary | ICD-10-CM | POA: Diagnosis not present

## 2016-12-05 DIAGNOSIS — M62830 Muscle spasm of back: Secondary | ICD-10-CM | POA: Diagnosis not present

## 2016-12-05 DIAGNOSIS — M546 Pain in thoracic spine: Secondary | ICD-10-CM | POA: Diagnosis not present

## 2016-12-06 ENCOUNTER — Encounter (HOSPITAL_BASED_OUTPATIENT_CLINIC_OR_DEPARTMENT_OTHER): Payer: Self-pay | Admitting: Emergency Medicine

## 2016-12-06 ENCOUNTER — Emergency Department (HOSPITAL_BASED_OUTPATIENT_CLINIC_OR_DEPARTMENT_OTHER): Payer: Medicare HMO

## 2016-12-06 ENCOUNTER — Emergency Department (HOSPITAL_BASED_OUTPATIENT_CLINIC_OR_DEPARTMENT_OTHER)
Admission: EM | Admit: 2016-12-06 | Discharge: 2016-12-06 | Disposition: A | Discharge status: Home / Self Care | Payer: Medicare HMO | Attending: Emergency Medicine | Admitting: Emergency Medicine

## 2016-12-06 DIAGNOSIS — Z79899 Other long term (current) drug therapy: Secondary | ICD-10-CM | POA: Insufficient documentation

## 2016-12-06 DIAGNOSIS — G43109 Migraine with aura, not intractable, without status migrainosus: Secondary | ICD-10-CM | POA: Insufficient documentation

## 2016-12-06 DIAGNOSIS — E119 Type 2 diabetes mellitus without complications: Secondary | ICD-10-CM | POA: Diagnosis not present

## 2016-12-06 DIAGNOSIS — Z7984 Long term (current) use of oral hypoglycemic drugs: Secondary | ICD-10-CM | POA: Diagnosis not present

## 2016-12-06 DIAGNOSIS — J45909 Unspecified asthma, uncomplicated: Secondary | ICD-10-CM | POA: Insufficient documentation

## 2016-12-06 DIAGNOSIS — Z7982 Long term (current) use of aspirin: Secondary | ICD-10-CM | POA: Diagnosis not present

## 2016-12-06 DIAGNOSIS — H539 Unspecified visual disturbance: Secondary | ICD-10-CM | POA: Diagnosis not present

## 2016-12-06 DIAGNOSIS — R4701 Aphasia: Secondary | ICD-10-CM | POA: Diagnosis present

## 2016-12-06 LAB — CBC WITH DIFFERENTIAL/PLATELET
BASOS ABS: 0 10*3/uL (ref 0.0–0.1)
BASOS PCT: 0 %
EOS ABS: 0.2 10*3/uL (ref 0.0–0.7)
EOS PCT: 2 %
HCT: 37.5 % (ref 36.0–46.0)
Hemoglobin: 12.7 g/dL (ref 12.0–15.0)
Lymphocytes Relative: 41 %
Lymphs Abs: 2.8 10*3/uL (ref 0.7–4.0)
MCH: 29.3 pg (ref 26.0–34.0)
MCHC: 33.9 g/dL (ref 30.0–36.0)
MCV: 86.4 fL (ref 78.0–100.0)
MONO ABS: 0.4 10*3/uL (ref 0.1–1.0)
Monocytes Relative: 6 %
Neutro Abs: 3.4 10*3/uL (ref 1.7–7.7)
Neutrophils Relative %: 51 %
PLATELETS: 185 10*3/uL (ref 150–400)
RBC: 4.34 MIL/uL (ref 3.87–5.11)
RDW: 13.5 % (ref 11.5–15.5)
WBC: 6.7 10*3/uL (ref 4.0–10.5)

## 2016-12-06 LAB — BASIC METABOLIC PANEL
Anion gap: 12 (ref 5–15)
BUN: 12 mg/dL (ref 6–20)
CALCIUM: 9.6 mg/dL (ref 8.9–10.3)
CO2: 25 mmol/L (ref 22–32)
CREATININE: 0.81 mg/dL (ref 0.44–1.00)
Chloride: 100 mmol/L — ABNORMAL LOW (ref 101–111)
Glucose, Bld: 93 mg/dL (ref 65–99)
Potassium: 3.9 mmol/L (ref 3.5–5.1)
SODIUM: 137 mmol/L (ref 135–145)

## 2016-12-06 LAB — CBG MONITORING, ED: GLUCOSE-CAPILLARY: 83 mg/dL (ref 65–99)

## 2016-12-06 MED ORDER — METOCLOPRAMIDE HCL 5 MG/ML IJ SOLN
10.0000 mg | Freq: Once | INTRAMUSCULAR | Status: AC
  Administered 2016-12-06: 10 mg via INTRAVENOUS
  Filled 2016-12-06: qty 2

## 2016-12-06 MED ORDER — DIPHENHYDRAMINE HCL 50 MG/ML IJ SOLN
12.5000 mg | Freq: Once | INTRAMUSCULAR | Status: AC
  Administered 2016-12-06: 12.5 mg via INTRAVENOUS
  Filled 2016-12-06: qty 1

## 2016-12-06 NOTE — ED Triage Notes (Signed)
Per family approximately 45 minutes PTA patient began having difficulty with her vision.  States she then was unable to speak and couldn't figure out the words she needed to say.  Per family and patient this is somewhat better but patient is still slow when she speaks.  Per family patient's speech seems slurred.

## 2016-12-06 NOTE — ED Provider Notes (Signed)
[facility] Provider Note   CSN: [id number] Arrival date & time: 12/06/16  1813     History   Chief Complaint Chief Complaint  Patient presents with  . Aphasia    HPI Anna Norris is a 76 y.o. female. Chief complaint is difficulty with speech and vision  HPI:  This is a 76 year old female. She presents here 35 minutes after onset of symptoms. Her last time known normal was 1740.  She was with her family. She had trouble speaking. She states she felt like she could not say what she wanted to say. She noticed that her right visual field was poor. She had difficulty seeing the right half of a license plate in front of her. She had difficulty looking at the window controls to her right. She was a front seat passenger. She was given 2 Excedrin by her family. Was brought here for evaluation.  Ultimately, the patient, family, and chart review we were able to determine she has had 2 previous episodes similar to this in 2014 2015. Both times she was evaluated at [facility] with MRI and neurological consultation. Both times was discharged with a diagnosis of classic migraine. She had migraines when she was "a girl". They did not start having them again until a few years ago. She followed up outpatient with Dr. Jannifer Franklin of neurology and again her diagnosis was classic migraine.  She has now developed a mild bitemporal headache. Not sudden or severe.  Upon arrival here all of her symptoms have resolved and she states initially that she just "didn't feel right".  Past Medical History:  Diagnosis Date  . Asthma   . Complication of anesthesia    can not have novacain  . Diabetes mellitus    A1C < 7  . Tachycardia    no problems now.    Patient Active Problem List   Diagnosis Date Noted  . Headache 05/08/2013  . Aphasia 05/08/2013  . Other and unspecified hyperlipidemia 08/14/2012  . TIA (transient ischemic attack) 04/26/2012  . DM2 (diabetes mellitus, type 2) ([place])  04/26/2012  . Altered mental status 04/26/2012    Past Surgical History:  Procedure Laterality Date  . BREAST LUMPECTOMY    . FOOT SURGERY    . HAND SURGERY    . TUBAL LIGATION    . uterine cyst      OB History    No data available       Home Medications    Prior to Admission medications   Medication Sig Start Date End Date Taking? Authorizing Provider  acetaminophen (TYLENOL) 500 MG tablet Take 500 mg by mouth daily as needed for headache.    [provider]  aspirin 81 MG tablet Take 1 tablet (81 mg total) by mouth daily. 08/14/12   [name], NP  atorvastatin (LIPITOR) 20 MG tablet Take 20 mg by mouth daily.    [provider]  doxycycline (VIBRAMYCIN) 100 MG capsule Take 1 capsule (100 mg total) by mouth 2 (two) times daily. 05/23/15   [name], MD  glimepiride (AMARYL) 2 MG tablet Take 2 mg by mouth daily before breakfast.    [provider]  metFORMIN (GLUCOPHAGE) 500 MG tablet Take 1,000 mg by mouth 2 (two) times daily with a meal.    [provider]  PROAIR HFA 108 (90 BASE) MCG/ACT inhaler Inhale 2 puffs into the lungs every 4 (four) hours as needed.  10/07/12   [provider]  Family History Family History  Problem Relation Age of Onset  . Heart failure Father   . Lymphoma Brother   . Stroke Neg Hx     Social History Social History  Substance Use Topics  . Smoking status: Never Smoker  . Smokeless tobacco: Never Used  . Alcohol use Yes     Comment: occassionally     Allergies   Other; Shellfish allergy; Strawberry extract; and Yellow dyes (non-tartrazine)   Review of Systems Review of Systems  Constitutional: Negative for appetite change, chills, diaphoresis, fatigue and fever.  HENT: Negative for mouth sores, sore throat and trouble swallowing.   Eyes: Positive for visual disturbance.  Respiratory: Negative for cough, chest tightness, shortness of breath and wheezing.   Cardiovascular: Negative  for chest pain.  Gastrointestinal: Negative for abdominal distention, abdominal pain, diarrhea, nausea and vomiting.  Endocrine: Negative for polydipsia, polyphagia and polyuria.  Genitourinary: Negative for dysuria, frequency and hematuria.  Musculoskeletal: Negative for gait problem.  Skin: Negative for color change, pallor and rash.  Neurological: Positive for facial asymmetry and speech difficulty. Negative for dizziness, syncope, light-headedness and headaches.  Hematological: Does not bruise/bleed easily.  Psychiatric/Behavioral: Negative for behavioral problems and confusion.     Physical Exam Updated Vital Signs BP (!) 158/83   Pulse 75   Resp 15   Ht 5\' 6"  (1.676 m)   Wt 74.8 kg (165 lb)   SpO2 97%   BMI 26.63 kg/m   Physical Exam  Constitutional: She is oriented to person, place, and time. She appears well-developed and well-nourished. No distress.  Awake and alert. Lucid and oriented. No distress.  HENT:  Head: Normocephalic.  Eyes: Pupils are equal, round, and reactive to light. Conjunctivae are normal. No scleral icterus.  Neck: Normal range of motion. Neck supple. No thyromegaly present.  Cardiovascular: Normal rate and regular rhythm.  Exam reveals no gallop and no friction rub.   No murmur heard. Pulmonary/Chest: Effort normal and breath sounds normal. No respiratory distress. She has no wheezes. She has no rales.  Abdominal: Soft. Bowel sounds are normal. She exhibits no distension. There is no tenderness. There is no rebound.  Musculoskeletal: Normal range of motion.  Neurological: She is alert and oriented to person, place, and time.  Normal cranial nerves. No visual field deficits. No dysarthria. Normal strength and sensation to the 4 tremors. No pronator drift. No leg drift. She can stand. She can ambulate without ataxia. NIH score is 0  Skin: Skin is warm and dry. No rash noted.  Psychiatric: She has a normal mood and affect. Her behavior is normal.      ED Treatments / Results  Labs (all labs ordered are listed, but only abnormal results are displayed) Labs Reviewed  BASIC METABOLIC PANEL - Abnormal; Notable for the following:       Result Value   Chloride 100 (*)    All other components within normal limits  CBC WITH DIFFERENTIAL/PLATELET  CBG MONITORING, ED    EKG  EKG Interpretation None       Radiology Ct Head Wo Contrast  Result Date: 12/06/2016 CLINICAL DATA:  Vision difficulty unable to speak EXAM: CT HEAD WITHOUT CONTRAST TECHNIQUE: Contiguous axial images were obtained from the base of the skull through the vertex without intravenous contrast. COMPARISON:  MRI 05/09/2013, CT brain 05/08/2013 FINDINGS: Brain: No definite acute territorial infarction, hemorrhage, or intracranial mass is seen. Mild atrophy. Nonenlarged ventricles. Vascular: No hyperdense vessels. Scattered calcifications at the carotid siphons. Skull:  No fracture or suspicious bone lesion Sinuses/Orbits: Minimal mucosal thickening in the sphenoid and ethmoid sinuses. No acute orbital abnormality. Other: None IMPRESSION: No definite CT evidence for acute intracranial abnormality. MRI follow-up as clinically indicated. Electronically Signed   By: Donavan Foil M.D.   On: 12/06/2016 19:39    Procedures Procedures (including critical care time)  Medications Ordered in ED Medications  metoCLOPramide (REGLAN) injection 10 mg (10 mg Intravenous Given 12/06/16 1931)  diphenhydrAMINE (BENADRYL) injection 12.5 mg (12.5 mg Intravenous Given 12/06/16 1931)     Initial Impression / Assessment and Plan / ED Course  I have reviewed the triage vital signs and the nursing notes.  Pertinent labs & imaging results that were available during my care of the patient were reviewed by me and considered in my medical decision making (see chart for details).   differential diagnosis includes recurrent classic migraine, TIA, hemorrhage, CVA. CT scan ahead obtained and  normal. I discussed this case with Dr. Malen Gauze of urology in its entirety. He has reviewed her previous chart and MRI and neurology evaluations as have I. We are in agreement that in this patient with exact symptoms with 2 prior extensive workups she could be safely discharged. I reviewed this with her extensively. She states she is 245% certain that there are no atypical features of this presentation versus her last. Her neurological exams are normal. Her family including 2 daughters and husband have arrived. They reiterate to me that they feel she is normal now. Patient is discharged home. Recheck with recurrence. Outpatient neurology follow-up.  Final Clinical Impressions(s) / ED Diagnoses   Final diagnoses:  Migraine with aura and without status migrainosus, not intractable    New Prescriptions New Prescriptions   No medications on file     Tanna Furry, MD 12/06/16 2017

## 2016-12-06 NOTE — ED Notes (Signed)
Pt on monitor 

## 2016-12-06 NOTE — ED Notes (Signed)
ED Provider at bedside. 

## 2016-12-06 NOTE — Discharge Instructions (Signed)
Follow-up with your neurologist.  Return to ER with any new or worsening symptoms

## 2016-12-07 ENCOUNTER — Encounter (HOSPITAL_BASED_OUTPATIENT_CLINIC_OR_DEPARTMENT_OTHER): Payer: Self-pay | Admitting: Emergency Medicine

## 2016-12-07 ENCOUNTER — Emergency Department (HOSPITAL_BASED_OUTPATIENT_CLINIC_OR_DEPARTMENT_OTHER)
Admission: EM | Admit: 2016-12-07 | Discharge: 2016-12-07 | Disposition: A | Discharge status: Home / Self Care | Payer: Medicare HMO | Attending: Emergency Medicine | Admitting: Emergency Medicine

## 2016-12-07 DIAGNOSIS — G4489 Other headache syndrome: Secondary | ICD-10-CM | POA: Diagnosis not present

## 2016-12-07 DIAGNOSIS — G43809 Other migraine, not intractable, without status migrainosus: Secondary | ICD-10-CM | POA: Diagnosis not present

## 2016-12-07 DIAGNOSIS — Z79899 Other long term (current) drug therapy: Secondary | ICD-10-CM | POA: Diagnosis not present

## 2016-12-07 DIAGNOSIS — Z7984 Long term (current) use of oral hypoglycemic drugs: Secondary | ICD-10-CM | POA: Insufficient documentation

## 2016-12-07 DIAGNOSIS — E119 Type 2 diabetes mellitus without complications: Secondary | ICD-10-CM | POA: Insufficient documentation

## 2016-12-07 DIAGNOSIS — Z7982 Long term (current) use of aspirin: Secondary | ICD-10-CM | POA: Diagnosis not present

## 2016-12-07 DIAGNOSIS — G43109 Migraine with aura, not intractable, without status migrainosus: Secondary | ICD-10-CM

## 2016-12-07 DIAGNOSIS — J45909 Unspecified asthma, uncomplicated: Secondary | ICD-10-CM | POA: Insufficient documentation

## 2016-12-07 DIAGNOSIS — R51 Headache: Secondary | ICD-10-CM | POA: Diagnosis present

## 2016-12-07 DIAGNOSIS — R52 Pain, unspecified: Secondary | ICD-10-CM | POA: Diagnosis not present

## 2016-12-07 LAB — CBC
HCT: 37.6 % (ref 36.0–46.0)
HEMOGLOBIN: 12.8 g/dL (ref 12.0–15.0)
MCH: 29.4 pg (ref 26.0–34.0)
MCHC: 34 g/dL (ref 30.0–36.0)
MCV: 86.4 fL (ref 78.0–100.0)
Platelets: 187 10*3/uL (ref 150–400)
RBC: 4.35 MIL/uL (ref 3.87–5.11)
RDW: 13.3 % (ref 11.5–15.5)
WBC: 7.2 10*3/uL (ref 4.0–10.5)

## 2016-12-07 MED ORDER — SODIUM CHLORIDE 0.9 % IV SOLN
INTRAVENOUS | Status: DC
Start: 2016-12-07 — End: 2016-12-08

## 2016-12-07 MED ORDER — SODIUM CHLORIDE 0.9 % IV BOLUS (SEPSIS)
500.0000 mL | Freq: Once | INTRAVENOUS | Status: AC
  Administered 2016-12-07: 500 mL via INTRAVENOUS

## 2016-12-07 MED ORDER — DEXAMETHASONE SODIUM PHOSPHATE 10 MG/ML IJ SOLN
10.0000 mg | Freq: Once | INTRAMUSCULAR | Status: AC
  Administered 2016-12-07: 10 mg via INTRAVENOUS
  Filled 2016-12-07: qty 1

## 2016-12-07 MED ORDER — HYDROMORPHONE HCL 1 MG/ML IJ SOLN
1.0000 mg | Freq: Once | INTRAMUSCULAR | Status: AC
  Administered 2016-12-07: 1 mg via INTRAVENOUS
  Filled 2016-12-07: qty 1

## 2016-12-07 MED ORDER — ONDANSETRON HCL 4 MG/2ML IJ SOLN
INTRAMUSCULAR | Status: AC
  Filled 2016-12-07: qty 2

## 2016-12-07 MED ORDER — OXYCODONE-ACETAMINOPHEN 5-325 MG PO TABS
ORAL_TABLET | ORAL | Status: AC
  Administered 2016-12-07: 1
  Filled 2016-12-07: qty 1

## 2016-12-07 MED ORDER — METOCLOPRAMIDE HCL 5 MG/ML IJ SOLN
10.0000 mg | Freq: Once | INTRAMUSCULAR | Status: AC
  Administered 2016-12-07: 10 mg via INTRAVENOUS
  Filled 2016-12-07: qty 2

## 2016-12-07 MED ORDER — ONDANSETRON HCL 4 MG/2ML IJ SOLN
4.0000 mg | Freq: Once | INTRAMUSCULAR | Status: AC | PRN
  Administered 2016-12-07: 4 mg via INTRAVENOUS

## 2016-12-07 MED ORDER — DIPHENHYDRAMINE HCL 50 MG/ML IJ SOLN
25.0000 mg | Freq: Once | INTRAMUSCULAR | Status: AC
  Administered 2016-12-07: 25 mg via INTRAVENOUS
  Filled 2016-12-07: qty 1

## 2016-12-07 NOTE — ED Triage Notes (Signed)
Pt arrived via [facility] c/o HA that started at 10 this morning. Pt was seen yesterday here due to slurred speech and HA and dx with migraine. Pt reports pain in temporal area and feels like her normal migraine with nausea. Per EMS pt refused IV and zofran. Pt states she has taken 2 hydrocodone today with last dose around 2:00

## 2016-12-07 NOTE — ED Notes (Signed)
ED Provider at bedside. 

## 2016-12-07 NOTE — Discharge Instructions (Signed)
Go home and rest. Follow-up with neurology. Return for any new or worse symptoms. Symptoms consistent with a complicated migraine.

## 2016-12-07 NOTE — ED Provider Notes (Signed)
[facility] Provider Note   CSN: [id number] Arrival date & time: 12/07/16  1722     History   Chief Complaint Chief Complaint  Patient presents with  . Headache    HPI Anna Norris is a 76 y.o. female.  Patient seen in the emergency department yesterday. For similar symptoms. Patient has a history of complicated migraines. But usually does not get severe pain. Just gets neurological symptoms. These were first diagnosed about 3 years ago. She is followed by neurology for this. She has had MRI in the past. During her visit yesterday she had CT of her head which was negative. Yesterday's migraine was typical other than the fact there was some mild pain. Patient went home with improvement. Headache returned today around 12 noon and then gradually got worse. Had her typical neurological symptoms of difficulty speaking. And visual changes. Patient earlier in the morning felt fine. Headache was not of acute onset or severe immediately gradually built up. Patient denies fever. Patient's migraines have not been frequent usually once a year. Other than the severe headache part this is very typical for her usual migraine pattern yesterday patient was treated with Benadryl and Reglan with improvement in the symptoms.      Past Medical History:  Diagnosis Date  . Asthma   . Complication of anesthesia    can not have novacain  . Diabetes mellitus    A1C < 7  . Tachycardia    no problems now.    Patient Active Problem List   Diagnosis Date Noted  . Headache 05/08/2013  . Aphasia 05/08/2013  . Other and unspecified hyperlipidemia 08/14/2012  . TIA (transient ischemic attack) 04/26/2012  . DM2 (diabetes mellitus, type 2) ([place]) 04/26/2012  . Altered mental status 04/26/2012    Past Surgical History:  Procedure Laterality Date  . BREAST LUMPECTOMY    . FOOT SURGERY    . HAND SURGERY    . TUBAL LIGATION    . uterine cyst      OB History    No data available        Home Medications    Prior to Admission medications   Medication Sig Start Date End Date Taking? Authorizing Provider  acetaminophen (TYLENOL) 500 MG tablet Take 500 mg by mouth daily as needed for headache.    [provider]  aspirin 81 MG tablet Take 1 tablet (81 mg total) by mouth daily. 08/14/12   [name], NP  atorvastatin (LIPITOR) 20 MG tablet Take 20 mg by mouth daily.    [provider]  doxycycline (VIBRAMYCIN) 100 MG capsule Take 1 capsule (100 mg total) by mouth 2 (two) times daily. 05/23/15   [name], MD  glimepiride (AMARYL) 2 MG tablet Take 2 mg by mouth daily before breakfast.    [provider]  metFORMIN (GLUCOPHAGE) 500 MG tablet Take 1,000 mg by mouth 2 (two) times daily with a meal.    [provider]  PROAIR HFA 108 (90 BASE) MCG/ACT inhaler Inhale 2 puffs into the lungs every 4 (four) hours as needed.  10/07/12   [provider]    Family History Family History  Problem Relation Age of Onset  . Heart failure Father   . Lymphoma Brother   . Stroke Neg Hx     Social History Social History  Substance Use Topics  . Smoking status: Never Smoker  . Smokeless tobacco: Never Used  . Alcohol use Yes  Comment: occassionally     Allergies   Other; Shellfish allergy; Strawberry extract; and Yellow dyes (non-tartrazine)   Review of Systems Review of Systems  Constitutional: Negative for fatigue and fever.  HENT: Negative for congestion.   Eyes: Positive for photophobia and visual disturbance.  Respiratory: Negative for shortness of breath.   Cardiovascular: Negative for chest pain.  Gastrointestinal: Negative for abdominal pain.  Genitourinary: Negative for dysuria.  Musculoskeletal: Negative for back pain and neck pain.  Skin: Negative for rash.  Neurological: Positive for speech difficulty and headaches.  Hematological: Does not bruise/bleed easily.  Psychiatric/Behavioral: Negative for confusion.      Physical Exam Updated Vital Signs BP (!) 173/84 (BP Location: Right Arm)   Pulse 95   Temp 98.6 F (37 C) (Oral)   Resp 16   Wt 74.8 kg (165 lb)   SpO2 93%   BMI 26.63 kg/m   Physical Exam  Constitutional: She is oriented to person, place, and time. She appears well-developed and well-nourished. No distress.  HENT:  Head: Normocephalic and atraumatic.  Mouth/Throat: Oropharynx is clear and moist.  Eyes: Conjunctivae and EOM are normal.  Neck: Neck supple.  Cardiovascular: Normal rate and regular rhythm.   Pulmonary/Chest: Effort normal and breath sounds normal. No respiratory distress.  Abdominal: Soft. Bowel sounds are normal.  Musculoskeletal: Normal range of motion.  Neurological: She is alert and oriented to person, place, and time. No cranial nerve deficit or sensory deficit. She exhibits normal muscle tone. Coordination normal.  But she is understandable. Able to follow all commands fine. No neuro deficit other than the possible difficulty with speaking. Eyes are sensitive to light. But no other significant abnormalities.  Skin: Skin is warm. No rash noted.  Nursing note and vitals reviewed.    ED Treatments / Results  Labs (all labs ordered are listed, but only abnormal results are displayed) Labs Reviewed  CBC    EKG  EKG Interpretation None       Radiology Ct Head Wo Contrast  Result Date: 12/06/2016 CLINICAL DATA:  Vision difficulty unable to speak EXAM: CT HEAD WITHOUT CONTRAST TECHNIQUE: Contiguous axial images were obtained from the base of the skull through the vertex without intravenous contrast. COMPARISON:  MRI 05/09/2013, CT brain 05/08/2013 FINDINGS: Brain: No definite acute territorial infarction, hemorrhage, or intracranial mass is seen. Mild atrophy. Nonenlarged ventricles. Vascular: No hyperdense vessels. Scattered calcifications at the carotid siphons. Skull: No fracture or suspicious bone lesion Sinuses/Orbits: Minimal mucosal thickening  in the sphenoid and ethmoid sinuses. No acute orbital abnormality. Other: None IMPRESSION: No definite CT evidence for acute intracranial abnormality. MRI follow-up as clinically indicated. Electronically Signed   By: Donavan Foil M.D.   On: 12/06/2016 19:39    Procedures Procedures (including critical care time)  Medications Ordered in ED Medications  0.9 %  sodium chloride infusion (not administered)  oxyCODONE-acetaminophen (PERCOCET/ROXICET) 5-325 MG per tablet (1 tablet  Given 12/07/16 1758)  ondansetron (ZOFRAN) injection 4 mg (4 mg Intravenous Given 12/07/16 1908)  sodium chloride 0.9 % bolus 500 mL (0 mLs Intravenous Stopped 12/07/16 2122)  dexamethasone (DECADRON) injection 10 mg (10 mg Intravenous Given 12/07/16 2003)  diphenhydrAMINE (BENADRYL) injection 25 mg (25 mg Intravenous Given 12/07/16 2005)  metoCLOPramide (REGLAN) injection 10 mg (10 mg Intravenous Given 12/07/16 2000)  HYDROmorphone (DILAUDID) injection 1 mg (1 mg Intravenous Given 12/07/16 2125)     Initial Impression / Assessment and Plan / ED Course  I have reviewed the triage vital signs and  the nursing notes.  Pertinent labs & imaging results that were available during my care of the patient were reviewed by me and considered in my medical decision making (see chart for details).    Patient CBC here today without any significant change from the one yesterday. Head CT was not repeated because it was negative yesterday. Patient treated here today with IV fluids IV Decadron Benadryl and Reglan. Patient with significant improvement in the speech aspect and the visual aspect of her migraine. But pain did not improve much the patient was given 1 mg of hydromorphone. Over a period of time of observation pain slowly started to improve. Patient was definitely speaking better and feeling better. Vision was better as well. Feel that most of her presentation is typical for her migraines. The headache part was migraine-like. Does  seem to be responding to treatment. Do not feel that there was any CVA or subarachnoid hemorrhage. Headache was of gradual onset. Patient with significant improvement of discharge. However headache was not completely resolved. Patient will follow-up with her neurologist.   Final Clinical Impressions(s) / ED Diagnoses   Final diagnoses:  Complicated migraine    New Prescriptions New Prescriptions   No medications on file     Fredia Sorrow, MD 12/08/16 [phone]

## 2016-12-11 DIAGNOSIS — M62830 Muscle spasm of back: Secondary | ICD-10-CM | POA: Diagnosis not present

## 2016-12-11 DIAGNOSIS — M9902 Segmental and somatic dysfunction of thoracic region: Secondary | ICD-10-CM | POA: Diagnosis not present

## 2016-12-11 DIAGNOSIS — M546 Pain in thoracic spine: Secondary | ICD-10-CM | POA: Diagnosis not present

## 2016-12-11 DIAGNOSIS — M9901 Segmental and somatic dysfunction of cervical region: Secondary | ICD-10-CM | POA: Diagnosis not present

## 2016-12-12 ENCOUNTER — Other Ambulatory Visit: Payer: Self-pay

## 2016-12-12 ENCOUNTER — Encounter: Payer: Self-pay | Admitting: Neurology

## 2016-12-12 ENCOUNTER — Ambulatory Visit (INDEPENDENT_AMBULATORY_CARE_PROVIDER_SITE_OTHER): Payer: Medicare HMO | Admitting: Neurology

## 2016-12-12 VITALS — BP 157/100 | HR 83 | Ht 66.0 in | Wt 164.4 lb

## 2016-12-12 DIAGNOSIS — R51 Headache: Principal | ICD-10-CM

## 2016-12-12 DIAGNOSIS — G441 Vascular headache, not elsewhere classified: Secondary | ICD-10-CM

## 2016-12-12 DIAGNOSIS — R519 Headache, unspecified: Secondary | ICD-10-CM

## 2016-12-12 DIAGNOSIS — G43009 Migraine without aura, not intractable, without status migrainosus: Secondary | ICD-10-CM

## 2016-12-12 MED ORDER — KETOROLAC TROMETHAMINE 60 MG/2ML IM SOLN
60.0000 mg | Freq: Once | INTRAMUSCULAR | Status: AC
  Administered 2016-12-12: 60 mg via INTRAMUSCULAR

## 2016-12-12 MED ORDER — KETOROLAC TROMETHAMINE 30 MG/ML IJ SOLN: 30.0000 mg | Freq: Once | INTRAMUSCULAR | Status: DC

## 2016-12-12 MED ORDER — ONDANSETRON 4 MG PO TBDP: 4.0000 mg | ORAL_TABLET | Freq: Once | ORAL | Status: DC

## 2016-12-12 MED ORDER — CLOPIDOGREL BISULFATE 75 MG PO TABS: 75.0000 mg | ORAL_TABLET | Freq: Every day | ORAL | 11 refills | Status: AC

## 2016-12-12 MED ORDER — ONDANSETRON 4 MG PO TBDP: 4.0000 mg | ORAL_TABLET | Freq: Three times a day (TID) | ORAL | 2 refills | Status: DC | PRN

## 2016-12-12 MED ORDER — TOPIRAMATE 25 MG PO TABS: 25.0000 mg | ORAL_TABLET | Freq: Two times a day (BID) | ORAL | 3 refills | Status: DC

## 2016-12-12 NOTE — Telephone Encounter (Signed)
error 

## 2016-12-12 NOTE — Progress Notes (Signed)
[facility] [address] [place]. [place] [address] [phone]       OFFICE CONSULT NOTE  Anna. Curt Jews Date of Birth:  [birth year] Medical Record Number:  [id number]   Referring MD:  [name] Reason for Referral:  Complicated migraine  HPI: Anna Norris is a 54 year pleasant Caucasian lady who had a recent episode of possible, to get a migraine. History is up 10 from the patient, husband and review of electronic medical records and personally reviewed imaging studies. She presented to the emergency room at [facility] on 12/06/16 with sudden onset of speech difficulties with trouble getting words out. She also noticed that her right-sided peripheral vision was poor. She had difficulty seeing the left right half of the license plate in front of her looking out the windows. She was given 2 tablets of Excedrin by the family and brought to [facility] emergency room. CT scan of the head was unremarkable. She had similar episodes in 2014 and 2015 where she was evaluated at [facility] an MRI scan of the brain and neurological consultation. She was diagnosed as computed migraine at that time and these vision episodes in the past followed by headache. She had a similar pattern this time and had a severe headache which was treated with a cocktail of Reglan, Decadron and Benadryl. She reported complete resolution of a headache. She went home but returned the next day with headache returning without speech or visual symptoms. Patient states that for the last 3 days now she has a typical migraine headache with the bitemporal and vertex disabling headaches with nausea and she threw up once. She has significant light and sound sensitivity and physical activity makes the headache worse. She has been taking Tylenol without relief. She states she gets migraines and episodes like this around once a year. He has not been seen in the office for several years. She denies specific  triggers for these episodes. She stated that she may have been exposed to arsenic several years ago when she notes the food from the back of an old house which and been painted with arsenic and were exposed to smoke. ROS:   14 system review of systems is positive for  hearing loss, ringing in the ears, light sensitivity, double vision, blurred vision, abdominal pain and swelling, black stools, diarrhea, nausea, vomiting, frequent waking, environmental allergies, incontinence of bladder, back pain, aching muscles, neck pain and stiffness, itching, milligrams, dizziness, headache, numbness, speech difficulty, weakness, facial drooping, confusion, anxiety, hallucinations and all other systems negative  PMH:  Past Medical History:  Diagnosis Date  . Asthma   . Complication of anesthesia    can not have novacain  . Diabetes mellitus    A1C < 7  . Headache   . Tachycardia    no problems now.  . Vision abnormalities    hole in left eye    Social History:  Social History   Social History  . Marital status: Married    Spouse name: N/A  . Number of children: N/A  . Years of education: N/A   Occupational History  . Not on file.   Social History Main Topics  . Smoking status: Never Smoker  . Smokeless tobacco: Never Used  . Alcohol use Yes     Comment: occassionally  . Drug use: No  . Sexual activity: Not on file   Other Topics Concern  . Not on file   Social History Narrative  .  No narrative on file    Medications:   Current Outpatient Prescriptions on File Prior to Visit  Medication Sig Dispense Refill  . acetaminophen (TYLENOL) 500 MG tablet Take 500 mg by mouth daily as needed for headache.    . glimepiride (AMARYL) 2 MG tablet Take 2 mg by mouth daily before breakfast.    . metFORMIN (GLUCOPHAGE) 500 MG tablet Take 1,000 mg by mouth 2 (two) times daily with a meal.    . PROAIR HFA 108 (90 BASE) MCG/ACT inhaler Inhale 2 puffs into the lungs every 4 (four) hours as needed.       No current facility-administered medications on file prior to visit.     Allergies:   Allergies  Allergen Reactions  . Other Other (See Comments)    Mold - headaches   . Shellfish Allergy Swelling  . Strawberry Extract Swelling    Strawberry with shrimp made lips swollen  . Yellow Dyes (Non-Tartrazine) Hives    Physical Exam General: well developed, well nourished, seated, in no evident distress Head: head normocephalic and atraumatic.   Neck: supple with no carotid or supraclavicular bruits Cardiovascular: regular rate and rhythm, no murmurs Musculoskeletal: no deformity Skin:  no rash/petichiae Vascular:  Normal pulses all extremities  Neurologic Exam Mental Status: Awake and fully alert. Oriented to place and time. Recent and remote memory intact. Attention span, concentration and fund of knowledge appropriate. Mood and affect appropriate.  Cranial Nerves: Fundoscopic exam reveals sharp disc margins. Pupils equal, briskly reactive to light. Extraocular movements full without nystagmus. Visual fields full to confrontation. Hearing diminished mildly in the left ear.. Facial sensation intact. Face, tongue, palate moves normally and symmetrically.  Motor: Normal bulk and tone. Normal strength in all tested extremity muscles. Sensory.: intact to touch , pinprick , position and vibratory sensation.  Coordination: Rapid alternating movements normal in all extremities. Finger-to-nose and heel-to-shin performed accurately bilaterally. Gait and Station: Arises from chair without difficulty. Stance is normal. Gait demonstrates normal stride length and balance . Able to heel, toe and tandem walk with slight difficulty.  Reflexes: 1+ and symmetric. Toes downgoing.       ASSESSMENT: 35 year  lady with episode of transient neurological dysfunction followed by severe migraine headaches likely atypical migraines. She's had similar episodes in the past with negative neurovascular workup at  that time.    PLAN: I had a long discussion the patient and her husband regarding her recurrent symptoms of focal neurological deficits followed by headaches likely representing her typical migraines. Recommend treatment acutely with Toradol and Zofran and check MRI scan of the brain with MRA of the brain since she is not having these episodes for several years. Start Topamax 20 5 AM twice daily to help with headache prophylaxis. Recommend Plavix 75 mg daily for stroke prevention as she developed hives with aspirin. Return for follow-up in 2 months with an aspect is not a call earlier if necessary. Greater than 50% time during this 45 minute consultation visit was spent on counseling and coordination of care about migraine headaches, discussion about her focal neurological symptoms and plan for evaluation and treatment discussion and answering questions. Antony Contras, MD  [facility] [address] [place], [place] [address]  Phone [phone] Fax [phone] Note: This document was prepared with digital dictation and possible smart phrase technology. Any transcriptional errors that result from this process are unintentional.

## 2016-12-12 NOTE — Patient Instructions (Addendum)
I had a long discussion the patient and her husband regarding her recurrent symptoms of focal neurological deficits followed by headaches likely representing her typical migraines. Recommend treatment acutely with Toradol and Zofran and check MRI scan of the brain with MRA of the brain since she is not having these episodes for several years. Start Topamax 20 5 AM twice daily to help with headache prophylaxis. Recommend Plavix 75 mg daily for stroke prevention as she developed hives with aspirin. Return for follow-up in 2 months with an aspect is not a call earlier if necessary.   Migraine Headache A migraine headache is a very strong throbbing pain on one side or both sides of your head. Migraines can also cause other symptoms. Talk with your doctor about what things may bring on (trigger) your migraine headaches. Follow these instructions at home: Medicines  Take over-the-counter and prescription medicines only as told by your doctor.  Do not drive or use heavy machinery while taking prescription pain medicine.  To prevent or treat constipation while you are taking prescription pain medicine, your doctor may recommend that you: ? Drink enough fluid to keep your pee (urine) clear or pale yellow. ? Take over-the-counter or prescription medicines. ? Eat foods that are high in fiber. These include fresh fruits and vegetables, whole grains, and beans. ? Limit foods that are high in fat and processed sugars. These include fried and sweet foods. Lifestyle  Avoid alcohol.  Do not use any products that contain nicotine or tobacco, such as cigarettes and e-cigarettes. If you need help quitting, ask your doctor.  Get at least 8 hours of sleep every night.  Limit your stress. General instructions   Keep a journal to find out what may bring on your migraines. For example, write down: ? What you eat and drink. ? How much sleep you get. ? Any change in what you eat or drink. ? Any change in your  medicines.  If you have a migraine: ? Avoid things that make your symptoms worse, such as bright lights. ? It may help to lie down in a dark, quiet room. ? Do not drive or use heavy machinery. ? Ask your doctor what activities are safe for you.  Keep all follow-up visits as told by your doctor. This is important. Contact a doctor if:  You get a migraine that is different or worse than your usual migraines. Get help right away if:  Your migraine gets very bad.  You have a fever.  You have a stiff neck.  You have trouble seeing.  Your muscles feel weak or like you cannot control them.  You start to lose your balance a lot.  You start to have trouble walking.  You pass out (faint). This information is not intended to replace advice given to you by your health care provider. Make sure you discuss any questions you have with your health care provider. Document Released: 12/14/2007 Document Revised: 09/24/2015 Document Reviewed: 08/23/2015 Elsevier Interactive Patient Education  2017 [employer].

## 2016-12-13 ENCOUNTER — Telehealth: Payer: Self-pay | Admitting: Neurology

## 2016-12-13 DIAGNOSIS — M546 Pain in thoracic spine: Secondary | ICD-10-CM | POA: Diagnosis not present

## 2016-12-13 DIAGNOSIS — M62830 Muscle spasm of back: Secondary | ICD-10-CM | POA: Diagnosis not present

## 2016-12-13 DIAGNOSIS — M9902 Segmental and somatic dysfunction of thoracic region: Secondary | ICD-10-CM | POA: Diagnosis not present

## 2016-12-13 DIAGNOSIS — M9901 Segmental and somatic dysfunction of cervical region: Secondary | ICD-10-CM | POA: Diagnosis not present

## 2016-12-13 NOTE — Telephone Encounter (Signed)
Rn tried calling pts cell number. A female pick up the phone and was talking. Rn call patients name twice, and than the phone disconnect.   Left vm on patients home number for her  to call back about topamax directions.

## 2016-12-13 NOTE — Telephone Encounter (Signed)
Pt calling and is asking for a call back re: topiramate (TOPAMAX) 25 MG tablet and ensuring that it is properly taken

## 2016-12-13 NOTE — Telephone Encounter (Signed)
Pt called back and the message was relayed from RN Katrina to let her know the medication is to be taken twice a day.  Pt has been scheduled for a 2 month f/u on 12-04 with Dr Leonie Man

## 2016-12-15 DIAGNOSIS — R0781 Pleurodynia: Secondary | ICD-10-CM | POA: Diagnosis not present

## 2016-12-18 ENCOUNTER — Telehealth: Payer: Self-pay | Admitting: Neurology

## 2016-12-18 NOTE — Telephone Encounter (Signed)
[employer] called and I spoke to the nurse Ren. He stated that since the MRA Head has been approved he is unable to get the MRI Brain approve. He stated it is redundant testing. He was going to forward the clinical notes to the physician and he stated if he requires a peer to peer a physician will reach out to Dr. Leonie Man.

## 2016-12-19 NOTE — Telephone Encounter (Signed)
[employer] did cover the MRA Head.

## 2016-12-19 NOTE — Telephone Encounter (Signed)
ok 

## 2017-01-02 DIAGNOSIS — R0781 Pleurodynia: Secondary | ICD-10-CM | POA: Diagnosis not present

## 2017-01-05 ENCOUNTER — Ambulatory Visit
Admission: RE | Admit: 2017-01-05 | Discharge: 2017-01-05 | Disposition: A | Discharge status: Home / Self Care | Payer: Medicare HMO | Source: Ambulatory Visit | Attending: Neurology | Admitting: Neurology

## 2017-01-05 ENCOUNTER — Other Ambulatory Visit: Payer: Self-pay | Admitting: Neurology

## 2017-01-05 DIAGNOSIS — G441 Vascular headache, not elsewhere classified: Secondary | ICD-10-CM

## 2017-01-05 DIAGNOSIS — R51 Headache: Secondary | ICD-10-CM | POA: Diagnosis not present

## 2017-01-08 ENCOUNTER — Telehealth: Payer: Self-pay | Admitting: Neurology

## 2017-01-08 NOTE — Telephone Encounter (Signed)
Patient is calling to get MRI results. °

## 2017-01-08 NOTE — Telephone Encounter (Signed)
Dr. Leonie Man pt needs MRI results.

## 2017-01-09 NOTE — Telephone Encounter (Signed)
MR brain shows age related mild shrinkage of the brain and hardening of arteries showed slight progression compared with previous MRI from 2014. No new or unexpected changes. Pt verbalized understanding.   ---MRA of head shows no major blockages or aneurysms to worry about. PT verbalized understanding. ---------

## 2017-01-19 ENCOUNTER — Encounter (HOSPITAL_BASED_OUTPATIENT_CLINIC_OR_DEPARTMENT_OTHER): Payer: Self-pay | Admitting: *Deleted

## 2017-01-19 ENCOUNTER — Emergency Department (HOSPITAL_BASED_OUTPATIENT_CLINIC_OR_DEPARTMENT_OTHER)
Admission: EM | Admit: 2017-01-19 | Discharge: 2017-01-19 | Disposition: A | Discharge status: Home / Self Care | Payer: Medicare HMO | Attending: Emergency Medicine | Admitting: Emergency Medicine

## 2017-01-19 DIAGNOSIS — E119 Type 2 diabetes mellitus without complications: Secondary | ICD-10-CM | POA: Insufficient documentation

## 2017-01-19 DIAGNOSIS — R233 Spontaneous ecchymoses: Secondary | ICD-10-CM | POA: Diagnosis not present

## 2017-01-19 DIAGNOSIS — Z7902 Long term (current) use of antithrombotics/antiplatelets: Secondary | ICD-10-CM | POA: Diagnosis not present

## 2017-01-19 DIAGNOSIS — T148XXA Other injury of unspecified body region, initial encounter: Secondary | ICD-10-CM

## 2017-01-19 DIAGNOSIS — Z79899 Other long term (current) drug therapy: Secondary | ICD-10-CM | POA: Insufficient documentation

## 2017-01-19 DIAGNOSIS — Z8673 Personal history of transient ischemic attack (TIA), and cerebral infarction without residual deficits: Secondary | ICD-10-CM | POA: Insufficient documentation

## 2017-01-19 DIAGNOSIS — J45909 Unspecified asthma, uncomplicated: Secondary | ICD-10-CM | POA: Diagnosis not present

## 2017-01-19 DIAGNOSIS — M7981 Nontraumatic hematoma of soft tissue: Secondary | ICD-10-CM | POA: Diagnosis not present

## 2017-01-19 LAB — CBC
HCT: 38.5 % (ref 36.0–46.0)
Hemoglobin: 12.8 g/dL (ref 12.0–15.0)
MCH: 29.2 pg (ref 26.0–34.0)
MCHC: 33.2 g/dL (ref 30.0–36.0)
MCV: 87.7 fL (ref 78.0–100.0)
PLATELETS: 187 10*3/uL (ref 150–400)
RBC: 4.39 MIL/uL (ref 3.87–5.11)
RDW: 13.5 % (ref 11.5–15.5)
WBC: 5.9 10*3/uL (ref 4.0–10.5)

## 2017-01-19 LAB — BASIC METABOLIC PANEL
ANION GAP: 1 — AB (ref 5–15)
BUN: 15 mg/dL (ref 6–20)
CO2: 27 mmol/L (ref 22–32)
Calcium: 9.2 mg/dL (ref 8.9–10.3)
Chloride: 108 mmol/L (ref 101–111)
Creatinine, Ser: 0.83 mg/dL (ref 0.44–1.00)
GFR calc Af Amer: >60 mL/min (ref >60)
GLUCOSE: 112 mg/dL — AB (ref 65–99)
POTASSIUM: 4.6 mmol/L (ref 3.5–5.1)
SODIUM: 136 mmol/L (ref 135–145)

## 2017-01-19 NOTE — ED Provider Notes (Signed)
[place] EMERGENCY DEPARTMENT Provider Note  CSN: [id number] Arrival date & time: 01/19/17 1855  Chief Complaint(s) Medication Problem  HPI Anna Norris is a 76 y.o. female had a TIA last month and was started Plavix last month. She is here for bruising on the left shin that started last night.  Patient started reading side effects from Plavix and is concerned she might be having TTP.  She denies any fevers change in mental status, headache, seizures, strokelike symptoms.  She denies any petechia.  She denies any trauma to the shin.  Denies any chest pain, shortness of breath, nausea, vomiting.  Denies any other physical complaints.  HPI  Past Medical History Past Medical History:  Diagnosis Date  . Asthma   . Complication of anesthesia    can not have novacain  . Diabetes mellitus    A1C < 7  . Headache   . Tachycardia    no problems now.  . Vision abnormalities    hole in left eye   Patient Active Problem List   Diagnosis Date Noted  . Headache 05/08/2013  . Aphasia 05/08/2013  . Other and unspecified hyperlipidemia 08/14/2012  . TIA (transient ischemic attack) 04/26/2012  . DM2 (diabetes mellitus, type 2) ([place]) 04/26/2012  . Altered mental status 04/26/2012   Home Medication(s) Prior to Admission medications   Medication Sig Start Date End Date Taking? Authorizing Provider  acetaminophen (TYLENOL) 500 MG tablet Take 500 mg by mouth daily as needed for headache.    [provider]  clopidogrel (PLAVIX) 75 MG tablet Take 1 tablet (75 mg total) by mouth daily. 12/12/16   Garvin Fila, MD  gabapentin (NEURONTIN) 300 MG capsule  10/19/16   [provider]  glimepiride (AMARYL) 2 MG tablet Take 2 mg by mouth daily before breakfast.    [provider]  HYDROcodone-acetaminophen (NORCO/VICODIN) 5-325 MG tablet  11/08/16   [provider]  lidocaine (LIDODERM) 5 %  10/18/16   [provider]  metFORMIN (GLUCOPHAGE) 500 MG  tablet Take 1,000 mg by mouth 2 (two) times daily with a meal.    [provider]  ondansetron (ZOFRAN ODT) 4 MG disintegrating tablet Take 1 tablet (4 mg total) by mouth every 8 (eight) hours as needed for nausea or vomiting. 12/12/16   Garvin Fila, MD  PROAIR HFA 108 (90 BASE) MCG/ACT inhaler Inhale 2 puffs into the lungs every 4 (four) hours as needed.  10/07/12   [provider]  topiramate (TOPAMAX) 25 MG tablet Take 1 tablet (25 mg total) by mouth 2 (two) times daily. 12/12/16   Garvin Fila, MD                                                                                                                                    Past Surgical History Past Surgical History:  Procedure Laterality Date  . BREAST LUMPECTOMY    .  FOOT SURGERY    . HAND SURGERY    . TUBAL LIGATION    . uterine cyst     Family History Family History  Problem Relation Age of Onset  . Heart failure Father   . Lymphoma Brother   . Stroke Neg Hx     Social History Social History  Substance Use Topics  . Smoking status: Never Smoker  . Smokeless tobacco: Never Used  . Alcohol use Yes     Comment: occassionally   Allergies Other; Shellfish allergy; Strawberry extract; and Yellow dyes (non-tartrazine)  Review of Systems Review of Systems All other systems are reviewed and are negative for acute change except as noted in the HPI  Physical Exam Vital Signs  I have reviewed the triage vital signs BP (!) 166/76 (BP Location: Left Arm)   Pulse 73   Temp 97.9 F (36.6 C) (Oral)   Resp 16   Ht 5\' 6"  (1.676 m)   Wt 74.4 kg (164 lb)   SpO2 98%   BMI 26.47 kg/m   Physical Exam  Constitutional: She is oriented to person, place, and time. She appears well-developed and well-nourished. No distress.  HENT:  Head: Normocephalic and atraumatic.  Nose: Nose normal.  Eyes: Pupils are equal, round, and reactive to light. Conjunctivae and EOM are normal. Right eye exhibits no discharge.  Left eye exhibits no discharge. No scleral icterus.  Neck: Normal range of motion. Neck supple.  Cardiovascular: Normal rate and regular rhythm.  Exam reveals no gallop and no friction rub.   No murmur heard. Pulmonary/Chest: Effort normal and breath sounds normal. No stridor. No respiratory distress. She has no rales.  Abdominal: Soft. She exhibits no distension. There is no tenderness.  Musculoskeletal: She exhibits no edema or tenderness.  Neurological: She is alert and oriented to person, place, and time.  Skin: Skin is warm and dry. Bruising noted. No lesion, no petechiae and no rash noted. She is not diaphoretic. No erythema.     Psychiatric: She has a normal mood and affect.  Vitals reviewed.   ED Results and Treatments Labs (all labs ordered are listed, but only abnormal results are displayed) Labs Reviewed  BASIC METABOLIC PANEL - Abnormal; Notable for the following:       Result Value   Glucose, Bld 112 (*)    Anion gap 1 (*)    All other components within normal limits  CBC                                                                                                                         EKG  EKG Interpretation  Date/Time:    Ventricular Rate:    PR Interval:    QRS Duration:   QT Interval:    QTC Calculation:   R Axis:     Text Interpretation:        Radiology No results found. Pertinent labs & imaging results that were available during my care of  the patient were reviewed by me and considered in my medical decision making (see chart for details).  Medications Ordered in ED Medications - No data to display                                                                                                                                  Procedures Procedures  (including critical care time)  Medical Decision Making / ED Course I have reviewed the nursing notes for this encounter and the patient's prior records (if available in EHR or on provided  paperwork).    Ecchymosis of the left shin.  Likely secondary to Plavix use.  No evidence to suggest TTP.  Screening CBC was obtained without evidence of thrombocytopenia or anemia. The patient is safe for discharge with strict return precautions.   Final Clinical Impression(s) / ED Diagnoses Final diagnoses:  Bruising   Disposition: Discharge  Condition: Good  I have discussed the results, Dx and Tx plan with the patient who expressed understanding and agree(s) with the plan. Discharge instructions discussed at great length. The patient was given strict return precautions who verbalized understanding of the instructions. No further questions at time of discharge.    New Prescriptions   No medications on file    Follow Up: Lawer[address] Cruel, [place] [id number] [phone]  Schedule an appointment as soon as possible for a visit  As needed      This chart was dictated using voice recognition software.  Despite best efforts to proofread,  errors can occur which can change the documentation meaning.   Fatima Blank, MD 01/19/17 [phone]

## 2017-01-19 NOTE — ED Triage Notes (Signed)
States she started blood thinner a month ago. She has been reading side effects and abdominal pain since taking the medication. She has a bruise on her left lower leg since yesterday and wants to be checked for a blood clot.

## 2017-01-19 NOTE — ED Notes (Addendum)
Slight bruising to shin on left leg,  Denies pain also feels knots in rt abd onset 1 month ago since starting blood thinner,  Read papers about meds and got concerned with side effects

## 2017-02-20 ENCOUNTER — Ambulatory Visit (INDEPENDENT_AMBULATORY_CARE_PROVIDER_SITE_OTHER): Payer: Medicare HMO | Admitting: Neurology

## 2017-02-20 ENCOUNTER — Encounter: Payer: Self-pay | Admitting: Neurology

## 2017-02-20 ENCOUNTER — Encounter (INDEPENDENT_AMBULATORY_CARE_PROVIDER_SITE_OTHER): Payer: Self-pay

## 2017-02-20 VITALS — BP 175/80 | HR 74 | Wt 164.0 lb

## 2017-02-20 DIAGNOSIS — I6521 Occlusion and stenosis of right carotid artery: Secondary | ICD-10-CM

## 2017-02-20 DIAGNOSIS — G43009 Migraine without aura, not intractable, without status migrainosus: Secondary | ICD-10-CM | POA: Diagnosis not present

## 2017-02-20 NOTE — Progress Notes (Signed)
[facility] [address] [place]. [place] [address] [phone]       OFFICE FOLLOW UP VISIT NOTE  Anna. Anna Norris Date of Birth:  Aug 15, 1940 Medical Record Number:  [id number]   Referring MD:  [name] Reason for Referral:  Complicated migraine  HPI: Initial consult 12/12/2016 : Anna Norris is a 70 year pleasant Caucasian lady who had a recent episode of possible, to get a migraine. History is up 10 from the patient, husband and review of electronic medical records and personally reviewed imaging studies. She presented to the emergency room at [facility] on 12/06/16 with sudden onset of speech difficulties with trouble getting words out. She also noticed that her right-sided peripheral vision was poor. She had difficulty seeing the left right half of the license plate in front of her looking out the windows. She was given 2 tablets of Excedrin by the family and brought to [facility] emergency room. CT scan of the head was unremarkable. She had similar episodes in 2014 and 2015 where she was evaluated at [facility] an MRI scan of the brain and neurological consultation. She was diagnosed as computed migraine at that time and these vision episodes in the past followed by headache. She had a similar pattern this time and had a severe headache which was treated with a cocktail of Reglan, Decadron and Benadryl. She reported complete resolution of a headache. She went home but returned the next day with headache returning without speech or visual symptoms. Patient states that for the last 3 days now she has a typical migraine headache with the bitemporal and vertex disabling headaches with nausea and she threw up once. She has significant light and sound sensitivity and physical activity makes the headache worse. She has been taking Tylenol without relief. She states she gets migraines and episodes like this around once a year. He has not been seen in the office  for several years. She denies specific triggers for these episodes. She stated that she may have been exposed to arsenic several years ago when she notes the food from the back of an old house which and been painted with arsenic and were exposed to smoke. Update 02/20/2017 : She returns for follow-up after last visit 3 months ago. She states she'll doing well and has not had any recurrent episodes of headaches or transient neurological dysfunction. She is feeling overall much better. She is complaining of rib cage pain and was diagnosed with costochondritis. The patient disc and states that she is noticed abdominal pain and discomfort and is wondering if this is due to Topamax. She states she has about 4-50% known right carotid stenosis and would request a follow-up carotid Doppler study. She remains on Plavix which is tolerating well without bruising and bleeding. She states her sugars have been good  Under control and herblood pressure is usually fine but is elevated today in office. ROS:   14 system review of systems is positive for   abdominal and stomach pain and rib cage pain and all other systems negative and all other systems negative  PMH:  Past Medical History:  Diagnosis Date  . Asthma   . Complication of anesthesia    can not have novacain  . Diabetes mellitus    A1C < 7  . Headache   . Tachycardia    no problems now.  . Vision abnormalities    hole in left eye    Social History:  Social  History   Socioeconomic History  . Marital status: Married    Spouse name: Not on file  . Number of children: Not on file  . Years of education: Not on file  . Highest education level: Not on file  Social Needs  . Financial resource strain: Not on file  . Food insecurity - worry: Not on file  . Food insecurity - inability: Not on file  . Transportation needs - medical: Not on file  . Transportation needs - non-medical: Not on file  Occupational History  . Not on file  Tobacco Use  .  Smoking status: Never Smoker  . Smokeless tobacco: Never Used  Substance and Sexual Activity  . Alcohol use: Yes    Comment: occassionally  . Drug use: No  . Sexual activity: Not on file  Other Topics Concern  . Not on file  Social History Narrative  . Not on file    Medications:   Current Outpatient Medications on File Prior to Visit  Medication Sig Dispense Refill  . acetaminophen (TYLENOL) 500 MG tablet Take 500 mg by mouth daily as needed for headache.    . clopidogrel (PLAVIX) 75 MG tablet Take 1 tablet (75 mg total) by mouth daily. 30 tablet 11  . gabapentin (NEURONTIN) 300 MG capsule     . glimepiride (AMARYL) 2 MG tablet Take 2 mg by mouth daily before breakfast.    . lidocaine (LIDODERM) 5 %     . metFORMIN (GLUCOPHAGE) 500 MG tablet Take 1,000 mg by mouth 2 (two) times daily with a meal.    . ondansetron (ZOFRAN ODT) 4 MG disintegrating tablet Take 1 tablet (4 mg total) by mouth every 8 (eight) hours as needed for nausea or vomiting. 20 tablet 2  . PROAIR HFA 108 (90 BASE) MCG/ACT inhaler Inhale 2 puffs into the lungs every 4 (four) hours as needed.      No current facility-administered medications on file prior to visit.     Allergies:   Allergies  Allergen Reactions  . Other Other (See Comments)    Mold - headaches   . Shellfish Allergy Swelling  . Strawberry Extract Swelling    Strawberry with shrimp made lips swollen  . Yellow Dyes (Non-Tartrazine) Hives    Physical Exam General: well developed, well nourished Caucasian lady, seated, in no evident distress Head: head normocephalic and atraumatic.   Neck: supple with no carotid or supraclavicular bruits Cardiovascular: regular rate and rhythm, no murmurs Musculoskeletal: no deformity Skin:  no rash/petichiae Vascular:  Normal pulses all extremities  Neurologic Exam Mental Status: Awake and fully alert. Oriented to place and time. Recent and remote memory intact. Attention span, concentration and fund of  knowledge appropriate. Mood and affect appropriate.  Cranial Nerves: Fundoscopic exam reveals sharp disc margins. Pupils equal, briskly reactive to light. Extraocular movements full without nystagmus. Visual fields full to confrontation. Hearing diminished mildly in the left ear.. Facial sensation intact. Face, tongue, palate moves normally and symmetrically.  Motor: Normal bulk and tone. Normal strength in all tested extremity muscles. Sensory.: intact to touch , pinprick , position and vibratory sensation.  Coordination: Rapid alternating movements normal in all extremities. Finger-to-nose and heel-to-shin performed accurately bilaterally. Gait and Station: Arises from chair without difficulty. Stance is normal. Gait demonstrates normal stride length and balance . Able to heel, toe and tandem walk with slight difficulty.  Reflexes: 1+ and symmetric. Toes downgoing.       ASSESSMENT: 45 year  lady with episode  of transient neurological dysfunction followed by severe migraine headaches likely atypical migraines. She's had similar episodes in the past with negative neurovascular workup at that time.    PLAN: I had a long discussion with the patient regarding her recent episode of her atypical migraine from that she has recovered very well. I recommend she taper and discontinue the Topamax as she is having some abdominal discomfort and pain which may be related. If her discomfort continues a after a week since stopping Topamax I recommend she see a primary care physician for further evaluation. Continue Plavix for stroke prevention with strict control of hypertension with blood pressure goal below 130/90, lipids with LDL cholesterol goal below 70 mg percent and diabetes with hemoglobin A1c goal below 6.5%. Check follow-up carotid ultrasound study to evaluate her known mild right ICA stenosis. She will return for follow-up in the future in a year or call earlier if necessary. Greater than 50% time  during this 25 minute  visit was spent on counseling and coordination of care about migraine headaches, discussion about her focal neurological symptoms and plan for evaluation and treatment discussion and answering questions. Antony Contras, MD  [facility] [address] [place], [place] [address]  Phone [phone] Fax [phone] Note: This document was prepared with digital dictation and possible smart phrase technology. Any transcriptional errors that result from this process are unintentional.

## 2017-02-20 NOTE — Patient Instructions (Addendum)
I had a long discussion with the patient regarding her recent episode of her atypical migraine from that she has recovered very well. I recommend she taper and discontinue the Topamax as she is having some abdominal discomfort and pain which may be related. If her discomfort continues a after a week since stopping Topamax I recommend she see a primary care physician for further evaluation. Continue Plavix for stroke prevention with strict control of hypertension with blood pressure goal below 130/90, lipids with LDL cholesterol goal below 70 mg percent and diabetes with hemoglobin A1c goal below 6.5%. Check follow-up carotid ultrasound study to evaluate her known mild right ICA stenosis. She will return for follow-up in the future in a year or call earlier if necessary.

## 2017-03-08 ENCOUNTER — Ambulatory Visit (HOSPITAL_COMMUNITY)
Admission: RE | Admit: 2017-03-08 | Discharge: 2017-03-08 | Disposition: A | Discharge status: Home / Self Care | Payer: Medicare HMO | Source: Ambulatory Visit | Attending: Neurology | Admitting: Neurology

## 2017-03-08 DIAGNOSIS — I6523 Occlusion and stenosis of bilateral carotid arteries: Secondary | ICD-10-CM | POA: Diagnosis not present

## 2017-03-08 DIAGNOSIS — I6521 Occlusion and stenosis of right carotid artery: Secondary | ICD-10-CM

## 2017-03-08 NOTE — Progress Notes (Signed)
Carotid duplex prelim: Right 40-59% ICA stenosis. Left 1-39% ICA stenosis. Jaimin Krupka Eunice, RDMS, RVT  

## 2017-03-09 ENCOUNTER — Telehealth: Payer: Self-pay

## 2017-03-09 NOTE — Telephone Encounter (Signed)
Notes recorded by Marval Regal, RN on 03/09/2017 at 9:32 AM EST Tried call listed number twice. RN call twice, call pick up on first ring and had static noise in background. Rn will attempt again. ------

## 2017-03-09 NOTE — Telephone Encounter (Signed)
-----   Message from Garvin Fila, MD sent at 03/08/2017  4:25 PM EST ----- Kindly call the patient and let her know that carotid ultrasound study was unremarkable

## 2017-03-16 NOTE — Telephone Encounter (Signed)
Called and spoke with patient. I informed her Dr. Leonie Man has said that her carotid ultrasound study was unremarkable. She verbalized understanding and appreciation. She is aware of next f/u w/ Dr. Leonie Man scheduled for December 2019.

## 2017-03-30 DIAGNOSIS — E1142 Type 2 diabetes mellitus with diabetic polyneuropathy: Secondary | ICD-10-CM | POA: Diagnosis not present

## 2017-03-30 DIAGNOSIS — Z7984 Long term (current) use of oral hypoglycemic drugs: Secondary | ICD-10-CM | POA: Diagnosis not present

## 2017-04-03 DIAGNOSIS — I779 Disorder of arteries and arterioles, unspecified: Secondary | ICD-10-CM | POA: Diagnosis not present

## 2017-04-03 DIAGNOSIS — E1142 Type 2 diabetes mellitus with diabetic polyneuropathy: Secondary | ICD-10-CM | POA: Diagnosis not present

## 2017-04-03 DIAGNOSIS — Z7984 Long term (current) use of oral hypoglycemic drugs: Secondary | ICD-10-CM | POA: Diagnosis not present

## 2017-04-03 DIAGNOSIS — E7800 Pure hypercholesterolemia, unspecified: Secondary | ICD-10-CM | POA: Diagnosis not present

## 2017-04-03 DIAGNOSIS — Z0000 Encounter for general adult medical examination without abnormal findings: Secondary | ICD-10-CM | POA: Diagnosis not present

## 2017-04-03 DIAGNOSIS — R0781 Pleurodynia: Secondary | ICD-10-CM | POA: Diagnosis not present

## 2017-05-25 DIAGNOSIS — H547 Unspecified visual loss: Secondary | ICD-10-CM | POA: Diagnosis not present

## 2017-05-25 DIAGNOSIS — R109 Unspecified abdominal pain: Secondary | ICD-10-CM | POA: Diagnosis not present

## 2017-05-25 DIAGNOSIS — N644 Mastodynia: Secondary | ICD-10-CM | POA: Diagnosis not present

## 2017-05-30 DIAGNOSIS — N6323 Unspecified lump in the left breast, lower outer quadrant: Secondary | ICD-10-CM | POA: Diagnosis not present

## 2017-05-30 DIAGNOSIS — N6321 Unspecified lump in the left breast, upper outer quadrant: Secondary | ICD-10-CM | POA: Diagnosis not present

## 2017-05-30 DIAGNOSIS — N644 Mastodynia: Secondary | ICD-10-CM | POA: Diagnosis not present

## 2017-07-02 DIAGNOSIS — D225 Melanocytic nevi of trunk: Secondary | ICD-10-CM | POA: Diagnosis not present

## 2017-07-02 DIAGNOSIS — Z872 Personal history of diseases of the skin and subcutaneous tissue: Secondary | ICD-10-CM | POA: Diagnosis not present

## 2017-07-02 DIAGNOSIS — L821 Other seborrheic keratosis: Secondary | ICD-10-CM | POA: Diagnosis not present

## 2017-07-02 DIAGNOSIS — L218 Other seborrheic dermatitis: Secondary | ICD-10-CM | POA: Diagnosis not present

## 2017-07-02 DIAGNOSIS — L814 Other melanin hyperpigmentation: Secondary | ICD-10-CM | POA: Diagnosis not present

## 2017-07-04 DIAGNOSIS — Z79899 Other long term (current) drug therapy: Secondary | ICD-10-CM | POA: Diagnosis not present

## 2017-07-04 DIAGNOSIS — E1142 Type 2 diabetes mellitus with diabetic polyneuropathy: Secondary | ICD-10-CM | POA: Diagnosis not present

## 2017-07-04 DIAGNOSIS — E7800 Pure hypercholesterolemia, unspecified: Secondary | ICD-10-CM | POA: Diagnosis not present

## 2017-07-04 DIAGNOSIS — Z7984 Long term (current) use of oral hypoglycemic drugs: Secondary | ICD-10-CM | POA: Diagnosis not present

## 2017-08-21 DIAGNOSIS — R0789 Other chest pain: Secondary | ICD-10-CM | POA: Diagnosis not present

## 2017-08-21 DIAGNOSIS — R062 Wheezing: Secondary | ICD-10-CM | POA: Diagnosis not present

## 2017-08-21 DIAGNOSIS — M940 Chondrocostal junction syndrome [Tietze]: Secondary | ICD-10-CM | POA: Diagnosis not present

## 2017-09-25 DIAGNOSIS — M940 Chondrocostal junction syndrome [Tietze]: Secondary | ICD-10-CM | POA: Diagnosis not present

## 2017-09-25 DIAGNOSIS — J209 Acute bronchitis, unspecified: Secondary | ICD-10-CM | POA: Diagnosis not present

## 2017-10-02 DIAGNOSIS — N6323 Unspecified lump in the left breast, lower outer quadrant: Secondary | ICD-10-CM | POA: Diagnosis not present

## 2017-10-02 DIAGNOSIS — N6321 Unspecified lump in the left breast, upper outer quadrant: Secondary | ICD-10-CM | POA: Diagnosis not present

## 2017-10-10 DIAGNOSIS — M5414 Radiculopathy, thoracic region: Secondary | ICD-10-CM | POA: Diagnosis not present

## 2017-10-10 DIAGNOSIS — E1142 Type 2 diabetes mellitus with diabetic polyneuropathy: Secondary | ICD-10-CM | POA: Diagnosis not present

## 2017-10-10 DIAGNOSIS — R1011 Right upper quadrant pain: Secondary | ICD-10-CM | POA: Diagnosis not present

## 2017-10-16 ENCOUNTER — Other Ambulatory Visit: Payer: Self-pay | Admitting: Family Medicine

## 2017-10-16 DIAGNOSIS — R1011 Right upper quadrant pain: Secondary | ICD-10-CM

## 2017-10-19 ENCOUNTER — Ambulatory Visit
Admission: RE | Admit: 2017-10-19 | Discharge: 2017-10-19 | Disposition: A | Discharge status: Home / Self Care | Payer: Medicare HMO | Source: Ambulatory Visit | Attending: Family Medicine | Admitting: Family Medicine

## 2017-10-19 DIAGNOSIS — K828 Other specified diseases of gallbladder: Secondary | ICD-10-CM | POA: Diagnosis not present

## 2017-10-19 DIAGNOSIS — R1011 Right upper quadrant pain: Secondary | ICD-10-CM

## 2017-10-23 ENCOUNTER — Other Ambulatory Visit (HOSPITAL_COMMUNITY): Payer: Self-pay | Admitting: Family Medicine

## 2017-10-23 DIAGNOSIS — R1011 Right upper quadrant pain: Secondary | ICD-10-CM

## 2017-10-26 ENCOUNTER — Ambulatory Visit (HOSPITAL_COMMUNITY)
Admission: RE | Admit: 2017-10-26 | Discharge: 2017-10-26 | Disposition: A | Discharge status: Home / Self Care | Payer: Medicare HMO | Source: Ambulatory Visit | Attending: Family Medicine | Admitting: Family Medicine

## 2017-10-26 DIAGNOSIS — R1011 Right upper quadrant pain: Secondary | ICD-10-CM | POA: Diagnosis not present

## 2017-10-26 DIAGNOSIS — R109 Unspecified abdominal pain: Secondary | ICD-10-CM | POA: Diagnosis not present

## 2017-10-26 MED ORDER — TECHNETIUM TC 99M MEBROFENIN IV KIT
5.0000 | PACK | Freq: Once | INTRAVENOUS | Status: AC | PRN
  Administered 2017-10-26: 5 via INTRAVENOUS

## 2017-11-06 DIAGNOSIS — H35342 Macular cyst, hole, or pseudohole, left eye: Secondary | ICD-10-CM | POA: Diagnosis not present

## 2017-11-06 DIAGNOSIS — H2513 Age-related nuclear cataract, bilateral: Secondary | ICD-10-CM | POA: Diagnosis not present

## 2017-11-06 DIAGNOSIS — H2512 Age-related nuclear cataract, left eye: Secondary | ICD-10-CM | POA: Diagnosis not present

## 2017-11-06 DIAGNOSIS — H524 Presbyopia: Secondary | ICD-10-CM | POA: Diagnosis not present

## 2017-11-06 DIAGNOSIS — Z7984 Long term (current) use of oral hypoglycemic drugs: Secondary | ICD-10-CM | POA: Diagnosis not present

## 2017-11-06 DIAGNOSIS — E119 Type 2 diabetes mellitus without complications: Secondary | ICD-10-CM | POA: Diagnosis not present

## 2017-11-26 ENCOUNTER — Ambulatory Visit
Admission: RE | Admit: 2017-11-26 | Discharge: 2017-11-26 | Disposition: A | Discharge status: Home / Self Care | Payer: Medicare HMO | Source: Ambulatory Visit | Attending: Gastroenterology | Admitting: Gastroenterology

## 2017-11-26 ENCOUNTER — Other Ambulatory Visit: Payer: Self-pay | Admitting: Gastroenterology

## 2017-11-26 DIAGNOSIS — R1011 Right upper quadrant pain: Secondary | ICD-10-CM

## 2017-11-26 DIAGNOSIS — K824 Cholesterolosis of gallbladder: Secondary | ICD-10-CM | POA: Diagnosis not present

## 2017-11-26 DIAGNOSIS — R0781 Pleurodynia: Secondary | ICD-10-CM | POA: Diagnosis not present

## 2017-11-28 DIAGNOSIS — H2511 Age-related nuclear cataract, right eye: Secondary | ICD-10-CM | POA: Diagnosis not present

## 2017-11-28 DIAGNOSIS — H2512 Age-related nuclear cataract, left eye: Secondary | ICD-10-CM | POA: Diagnosis not present

## 2017-12-03 ENCOUNTER — Other Ambulatory Visit: Payer: Self-pay | Admitting: Gastroenterology

## 2017-12-03 DIAGNOSIS — R1011 Right upper quadrant pain: Secondary | ICD-10-CM

## 2017-12-12 ENCOUNTER — Ambulatory Visit
Admission: RE | Admit: 2017-12-12 | Discharge: 2017-12-12 | Disposition: A | Discharge status: Home / Self Care | Payer: Medicare HMO | Source: Ambulatory Visit | Attending: Gastroenterology | Admitting: Gastroenterology

## 2017-12-12 DIAGNOSIS — R1011 Right upper quadrant pain: Secondary | ICD-10-CM

## 2017-12-12 DIAGNOSIS — R109 Unspecified abdominal pain: Secondary | ICD-10-CM | POA: Diagnosis not present

## 2017-12-12 MED ORDER — IOPAMIDOL (ISOVUE-300) INJECTION 61%
100.0000 mL | Freq: Once | INTRAVENOUS | Status: AC | PRN
  Administered 2017-12-12: 100 mL via INTRAVENOUS

## 2017-12-25 DIAGNOSIS — H35352 Cystoid macular degeneration, left eye: Secondary | ICD-10-CM | POA: Diagnosis not present

## 2017-12-25 DIAGNOSIS — E113393 Type 2 diabetes mellitus with moderate nonproliferative diabetic retinopathy without macular edema, bilateral: Secondary | ICD-10-CM | POA: Diagnosis not present

## 2017-12-25 DIAGNOSIS — H2511 Age-related nuclear cataract, right eye: Secondary | ICD-10-CM | POA: Diagnosis not present

## 2017-12-25 DIAGNOSIS — H35342 Macular cyst, hole, or pseudohole, left eye: Secondary | ICD-10-CM | POA: Diagnosis not present

## 2017-12-26 DIAGNOSIS — K824 Cholesterolosis of gallbladder: Secondary | ICD-10-CM | POA: Diagnosis not present

## 2017-12-26 DIAGNOSIS — R1011 Right upper quadrant pain: Secondary | ICD-10-CM | POA: Diagnosis not present

## 2017-12-26 DIAGNOSIS — Z8673 Personal history of transient ischemic attack (TIA), and cerebral infarction without residual deficits: Secondary | ICD-10-CM | POA: Diagnosis not present

## 2018-01-15 DIAGNOSIS — H35352 Cystoid macular degeneration, left eye: Secondary | ICD-10-CM | POA: Diagnosis not present

## 2018-01-15 DIAGNOSIS — H35342 Macular cyst, hole, or pseudohole, left eye: Secondary | ICD-10-CM | POA: Diagnosis not present

## 2018-01-15 DIAGNOSIS — E113312 Type 2 diabetes mellitus with moderate nonproliferative diabetic retinopathy with macular edema, left eye: Secondary | ICD-10-CM | POA: Diagnosis not present

## 2018-02-19 DIAGNOSIS — E113312 Type 2 diabetes mellitus with moderate nonproliferative diabetic retinopathy with macular edema, left eye: Secondary | ICD-10-CM | POA: Diagnosis not present

## 2018-02-19 DIAGNOSIS — H35342 Macular cyst, hole, or pseudohole, left eye: Secondary | ICD-10-CM | POA: Diagnosis not present

## 2018-02-19 DIAGNOSIS — H35352 Cystoid macular degeneration, left eye: Secondary | ICD-10-CM | POA: Diagnosis not present

## 2018-02-19 DIAGNOSIS — H2511 Age-related nuclear cataract, right eye: Secondary | ICD-10-CM | POA: Diagnosis not present

## 2018-02-19 DIAGNOSIS — E113393 Type 2 diabetes mellitus with moderate nonproliferative diabetic retinopathy without macular edema, bilateral: Secondary | ICD-10-CM | POA: Diagnosis not present

## 2018-02-20 ENCOUNTER — Ambulatory Visit: Payer: Medicare HMO | Admitting: Neurology

## 2018-02-20 ENCOUNTER — Encounter: Payer: Self-pay | Admitting: Neurology

## 2018-02-20 VITALS — BP 163/93 | HR 79 | Ht 66.0 in | Wt 170.4 lb

## 2018-02-20 DIAGNOSIS — I6521 Occlusion and stenosis of right carotid artery: Secondary | ICD-10-CM | POA: Diagnosis not present

## 2018-02-20 DIAGNOSIS — M542 Cervicalgia: Secondary | ICD-10-CM

## 2018-02-20 NOTE — Patient Instructions (Signed)
had a long discussion with the patient regarding her recent episode of transient posterior neck pain which seems musculoskeletal in nature but appears to have resolved and was perhaps related to increased physical exertion that she had recently done around Thanksgiving.  I do not believe further testing is indicated for this and she is improved. . Continue Plavix for stroke prevention with strict control of hypertension with blood pressure goal below 130/90, lipids with LDL cholesterol goal below 70 mg percent and diabetes with hemoglobin A1c goal below 6.5%. Check follow-up carotid ultrasound study to evaluate her known mild right ICA stenosis.  No scheduled follow-up appointment is necessary but she may be referred back in the future as needed.

## 2018-02-20 NOTE — Progress Notes (Signed)
[facility] [address] [place]. [place] [address] [phone]       OFFICE CONSULT NOTE  Anna. Anna Norris Date of Birth:  [birth year] Medical Record Number:  [id number]   Referring MD:  [name] Reason for Referral:  Complicated migraine  HPI: Initial visit 12/12/16 : Anna Norris is a 18 year pleasant Caucasian lady who had a recent episode of possible, to get a migraine. History is up 10 from the patient, husband and review of electronic medical records and personally reviewed imaging studies. She presented to the emergency room at [facility] on 12/06/16 with sudden onset of speech difficulties with trouble getting words out. She also noticed that her right-sided peripheral vision was poor. She had difficulty seeing the left right half of the license plate in front of her looking out the windows. She was given 2 tablets of Excedrin by the family and brought to [facility] emergency room. CT scan of the head was unremarkable. She had similar episodes in 2014 and 2015 where she was evaluated at [facility] an MRI scan of the brain and neurological consultation. She was diagnosed as computed migraine at that time and these vision episodes in the past followed by headache. She had a similar pattern this time and had a severe headache which was treated with a cocktail of Reglan, Decadron and Benadryl. She reported complete resolution of a headache. She went home but returned the next day with headache returning without speech or visual symptoms. Patient states that for the last 3 days now she has a typical migraine headache with the bitemporal and vertex disabling headaches with nausea and she threw up once. She has significant light and sound sensitivity and physical activity makes the headache worse. She has been taking Tylenol without relief. She states she gets migraines and episodes like this around once a year. He has not been seen in the office for several  years. She denies specific triggers for these episodes. She stated that she may have been exposed to arsenic several years ago when she notes the food from the back of an old house which and been painted with arsenic and were exposed to smoke.  Update 02/20/2018 : She returns for follow-up after last visit more than a year ago.  She continues to do well has not had any episodes suggestive of typical migraines.  She however had an episode a week ago when she noticed pain in her occipital region.  She also had some trouble seeing and had to tilt her head.  This did not last long.  She took some Tylenol which helped.  It has not happened again.  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  She did have follow-up carotid ultrasound done in December 2018 which showed 40 to 59% right ICA stenosis which was stable from prior study.  She has had no symptoms of stroke or TIAs.  She remains on Plavix which is tolerating well without bruising or bleeding.  She states her diabetes is well controlled.  She cannot tell me what last A1c was.  Blood pressure remains slightly elevated and today it is 163/93. ROS:   14 system review of systems is positive for   activity change, ringing in the ears, hearing loss, light sensitivity, loss of vision, eye  pain, cough, chest tightness, abdominal pain and swelling, apnea, frequent waking, snoring, food allergies, incontinence of bladder, bladder urgency, joint and neck pain, neck stiffness, speech difficulty and all other systems negative   PMH:  Past Medical History:  Diagnosis Date  . Asthma   . Complication of anesthesia    can not have novacain  . Diabetes mellitus    A1C < 7  . Headache   . Tachycardia    no problems now.  . Vision abnormalities    hole in left eye    Social History:  Social  History   Socioeconomic History  . Marital status: Married    Spouse name: Not on file  . Number of children: Not on file  . Years of education: Not on file  . Highest education level: Not on file  Occupational History  . Not on file  Social Needs  . Financial resource strain: Not on file  . Food insecurity:    Worry: Not on file    Inability: Not on file  . Transportation needs:    Medical: Not on file    Non-medical: Not on file  Tobacco Use  . Smoking status: Never Smoker  . Smokeless tobacco: Never Used  Substance and Sexual Activity  . Alcohol use: Yes    Comment: once a year  . Drug use: No  . Sexual activity: Not on file  Lifestyle  . Physical activity:    Days per week: Not on file    Minutes per session: Not on file  . Stress: Not on file  Relationships  . Social connections:    Talks on phone: Not on file    Gets together: Not on file    Attends religious service: Not on file    Active member of club or organization: Not on file    Attends meetings of clubs or organizations: Not on file    Relationship status: Not on file  . Intimate partner violence:    Fear of current or ex partner: Not on file    Emotionally abused: Not on file    Physically abused: Not on file    Forced sexual activity: Not on file  Other Topics Concern  . Not on file  Social History Narrative  . Not on file    Medications:   Current Outpatient Medications on File Prior to Visit  Medication Sig Dispense Refill  . acetaminophen (TYLENOL) 500 MG tablet Take 500 mg by mouth daily as needed for headache.    . albuterol (PROVENTIL HFA;VENTOLIN HFA) 108 (90 Base) MCG/ACT inhaler Inhale into the lungs.    . clopidogrel (PLAVIX) 75 MG tablet Take 1 tablet (75 mg total) by mouth daily. 30 tablet 11  . Difluprednate 0.05 % EMUL One drop in operative eye BID starting after surgery for 3 weeks    . gabapentin (NEURONTIN) 300 MG capsule     . glimepiride (AMARYL) 2 MG tablet Take 2 mg by  mouth daily before breakfast.    . lidocaine (LIDODERM) 5 %     . metFORMIN (GLUCOPHAGE) 500 MG tablet Take 1,000 mg by mouth 2 (two) times daily with a meal.    . ondansetron (ZOFRAN ODT) 4 MG disintegrating tablet Take 1 tablet (4 mg total) by mouth every 8 (eight) hours as needed for nausea or vomiting. 20 tablet 2  . pioglitazone (ACTOS) 15 MG tablet TAKE 1 TABLET BY MOUTH ONCE DAILY FOR 30 DAYS    . PROAIR HFA  108 (90 BASE) MCG/ACT inhaler Inhale 2 puffs into the lungs every 4 (four) hours as needed.      No current facility-administered medications on file prior to visit.     Allergies:   Allergies  Allergen Reactions  . Other Other (See Comments)    Mold - headaches   . Shellfish Allergy Swelling  . Strawberry Extract Swelling    Strawberry with shrimp made lips swollen  . Yellow Dyes (Non-Tartrazine) Hives    Physical Exam General: well developed, well nourished elderly Caucasian lady, seated, in no evident distress Head: head normocephalic and atraumatic.   Neck: supple with no carotid or supraclavicular bruits Cardiovascular: regular rate and rhythm, no murmurs Musculoskeletal: no deformity Skin:  no rash/petichiae Vascular:  Normal pulses all extremities  Neurologic Exam Mental Status: Awake and fully alert. Oriented to place and time. Recent and remote memory intact. Attention span, concentration and fund of knowledge appropriate. Mood and affect appropriate.  Cranial Nerves: Fundoscopic exam reveals sharp disc margins. Pupils equal, briskly reactive to light. Extraocular movements full without nystagmus. Visual fields full to confrontation. Hearing diminished mildly in the left ear.. Facial sensation intact. Face, tongue, palate moves normally and symmetrically.  Motor: Normal bulk and tone. Normal strength in all tested extremity muscles. Sensory.: intact to touch , pinprick , position and vibratory sensation.  Coordination: Rapid alternating movements normal in all  extremities. Finger-to-nose and heel-to-shin performed accurately bilaterally. Gait and Station: Arises from chair without difficulty. Stance is normal. Gait demonstrates normal stride length and balance . Able to heel, toe and tandem walk with slight difficulty.  Reflexes: 1+ and symmetric. Toes downgoing.       ASSESSMENT: 6 year  lady with episode of transient neurological dysfunction followed by severe migraine headaches likely atypical migraines. She's had similar episodes in the past with negative neurovascular workup at that time.New complaint of transient posterior neck pain which has resolved likely musculoskeletal origin    PLAN: I had a long discussion with the patient regarding her recent episode of transient posterior neck pain which seems musculoskeletal in nature but appears to have resolved and was perhaps related to increased physical exertion that she had recently done around Thanksgiving.  I do not believe further testing is indicated for this and she is improved. . Continue Plavix for stroke prevention with strict control of hypertension with blood pressure goal below 130/90, lipids with LDL cholesterol goal below 70 mg percent and diabetes with hemoglobin A1c goal below 6.5%. Check follow-up carotid ultrasound study to evaluate her known mild right ICA stenosis.  No scheduled follow-up appointment is necessary but she may be referred back in the future as needed. Greater than 50% time during this 25 minute   visit was spent on counseling and coordination of care about migraine headaches, discussion about her focal neurological symptoms and plan for evaluation and treatment discussion and answering questions. Antony Contras, MD  [facility] [address] [place], [place] [address]  Phone [phone] Fax [phone] Note: This document was prepared with digital dictation and possible smart phrase technology. Any transcriptional errors that  result from this process are unintentional.

## 2018-02-27 ENCOUNTER — Encounter (HOSPITAL_COMMUNITY): Payer: Medicare HMO

## 2018-03-04 ENCOUNTER — Ambulatory Visit (HOSPITAL_COMMUNITY)
Admission: RE | Admit: 2018-03-04 | Discharge: 2018-03-04 | Disposition: A | Discharge status: Home / Self Care | Payer: Medicare HMO | Source: Ambulatory Visit | Attending: Neurology | Admitting: Neurology

## 2018-03-04 DIAGNOSIS — I6521 Occlusion and stenosis of right carotid artery: Secondary | ICD-10-CM | POA: Diagnosis not present

## 2018-03-04 NOTE — Progress Notes (Signed)
Bilateral carotid duplex completed - Preliminary results can be found in chart review CV Proc. [facility], [place] 03/04/2018, 12:21 PM

## 2018-03-07 ENCOUNTER — Telehealth: Payer: Self-pay

## 2018-03-07 NOTE — Telephone Encounter (Signed)
-----   Message from Garvin Fila, MD sent at 03/05/2018 11:03 AM EST ----- kindly inform the patient that carotid ultrasound shows shows mild 40 to 59% narrowing of the carotid artery on the right and no significant narrowing on the left.  No medication  Or treatment changes are necessary at the present time.

## 2018-03-07 NOTE — Telephone Encounter (Signed)
Rn call patient about her vas carotid ultrasound shows shows mild 40 to 59% narrowing of the carotid artery on the right and no significant narrowing on the left. No medication Or treatment changes are necessary at the present time. Pt verbalized understanding.

## 2018-03-11 DIAGNOSIS — F411 Generalized anxiety disorder: Secondary | ICD-10-CM | POA: Diagnosis not present

## 2018-03-11 DIAGNOSIS — S300XXA Contusion of lower back and pelvis, initial encounter: Secondary | ICD-10-CM | POA: Diagnosis not present

## 2018-03-29 DIAGNOSIS — H35352 Cystoid macular degeneration, left eye: Secondary | ICD-10-CM | POA: Diagnosis not present

## 2018-03-29 DIAGNOSIS — H35342 Macular cyst, hole, or pseudohole, left eye: Secondary | ICD-10-CM | POA: Diagnosis not present

## 2018-03-29 DIAGNOSIS — H2511 Age-related nuclear cataract, right eye: Secondary | ICD-10-CM | POA: Diagnosis not present

## 2018-03-29 DIAGNOSIS — E113312 Type 2 diabetes mellitus with moderate nonproliferative diabetic retinopathy with macular edema, left eye: Secondary | ICD-10-CM | POA: Diagnosis not present

## 2018-03-29 DIAGNOSIS — E113393 Type 2 diabetes mellitus with moderate nonproliferative diabetic retinopathy without macular edema, bilateral: Secondary | ICD-10-CM | POA: Diagnosis not present

## 2018-04-02 DIAGNOSIS — Z961 Presence of intraocular lens: Secondary | ICD-10-CM | POA: Diagnosis not present

## 2018-04-02 DIAGNOSIS — H35352 Cystoid macular degeneration, left eye: Secondary | ICD-10-CM | POA: Diagnosis not present

## 2018-04-02 DIAGNOSIS — H35342 Macular cyst, hole, or pseudohole, left eye: Secondary | ICD-10-CM | POA: Diagnosis not present

## 2018-05-01 DIAGNOSIS — E113312 Type 2 diabetes mellitus with moderate nonproliferative diabetic retinopathy with macular edema, left eye: Secondary | ICD-10-CM | POA: Diagnosis not present

## 2018-05-01 DIAGNOSIS — H35352 Cystoid macular degeneration, left eye: Secondary | ICD-10-CM | POA: Diagnosis not present

## 2018-05-01 IMAGING — CT CT HEAD W/O CM
3 series · 15 of 47 positions shown, 18 images · non-contrast
Comparison: MRI 05/09/2013, CT brain 05/08/2013

CLINICAL DATA: Vision difficulty unable to speak

EXAM:
CT HEAD WITHOUT CONTRAST
TECHNIQUE: Contiguous axial images were obtained from the base of the skull
through the vertex without intravenous contrast.

[Series 2: head wo · axial · 0.40mm/px · z∈[+940,+1065]mm · 9 of 30 slices shown, 12 images]
[im 3/30  brain]
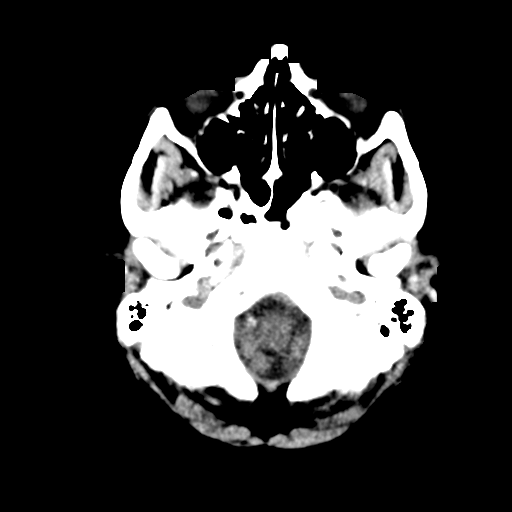
[im 3/30  bone]
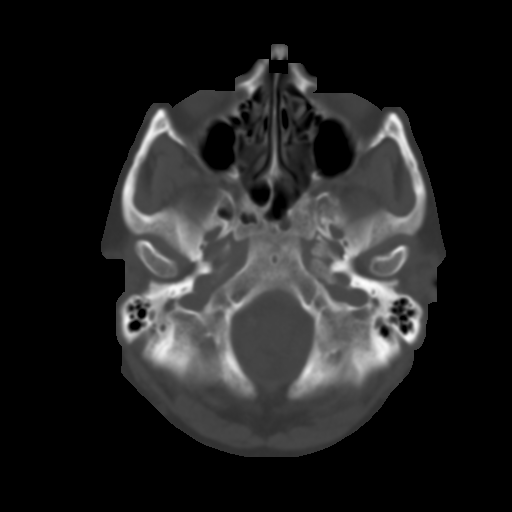
[im 6/30  brain]
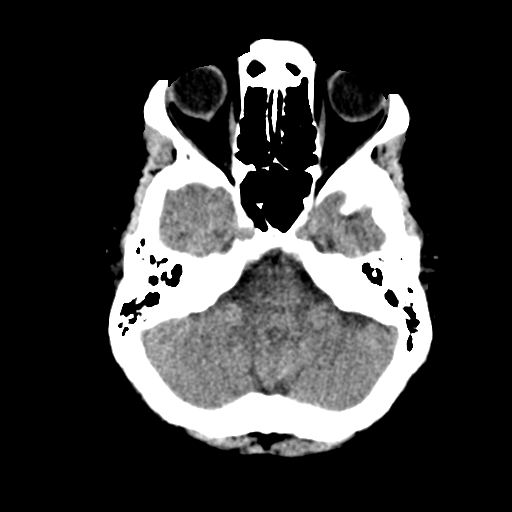
[im 9/30  brain]
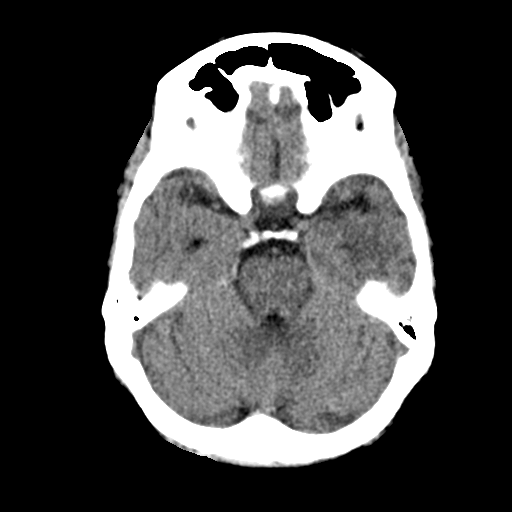
[im 12/30  brain]
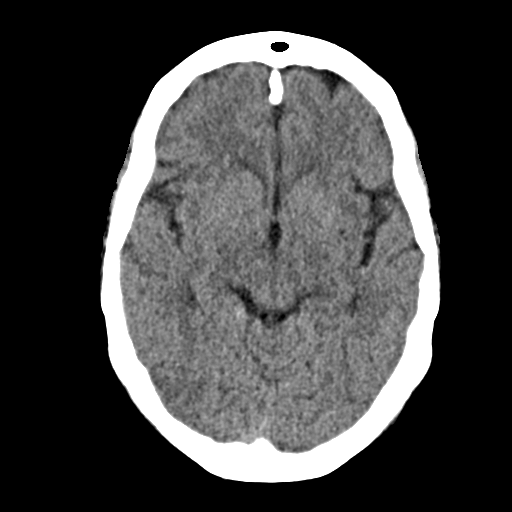
[im 16/30  brain]
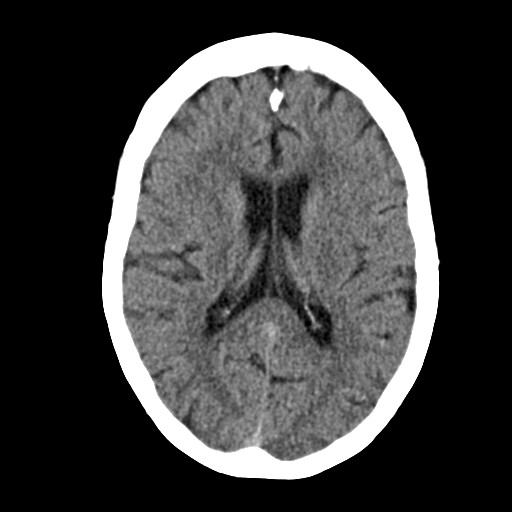
[im 16/30  bone]
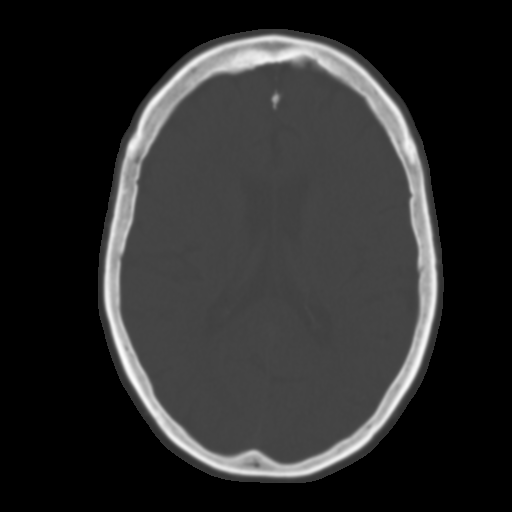
[im 19/30  brain]
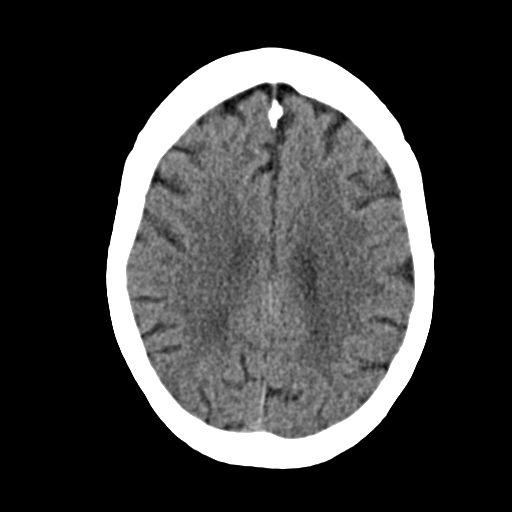
[im 22/30  brain]
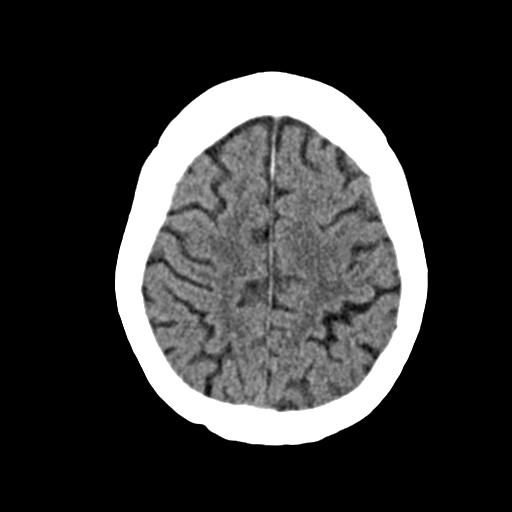
[im 25/30  brain]
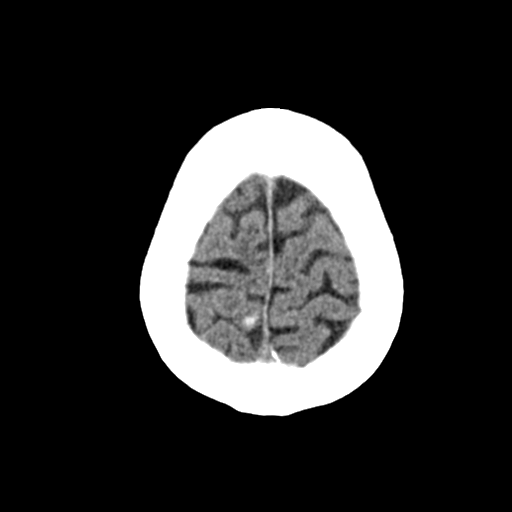
[im 28/30  brain]
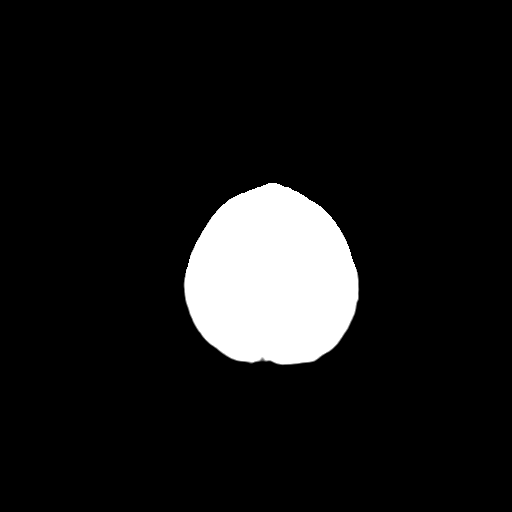
[im 28/30  bone]
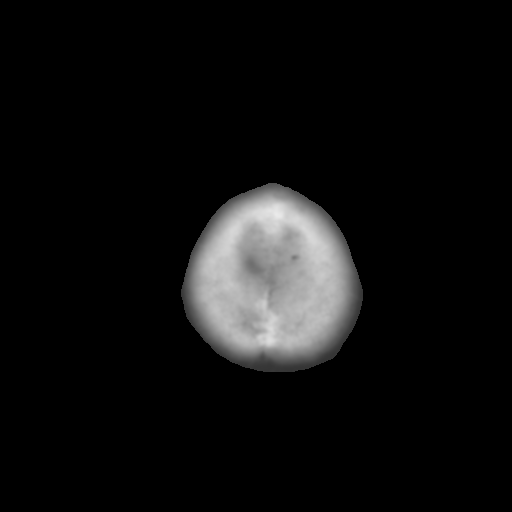

[Series 4: coronal soft · coronal · 0.29mm/px · 3 of 67 slices shown]
[im 23/67  brain]
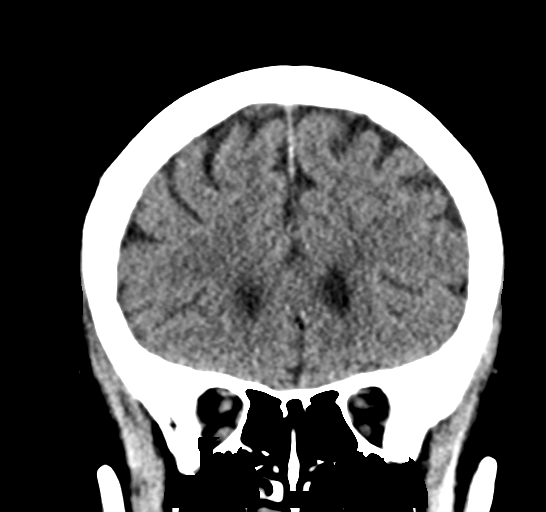
[im 30/67  brain]
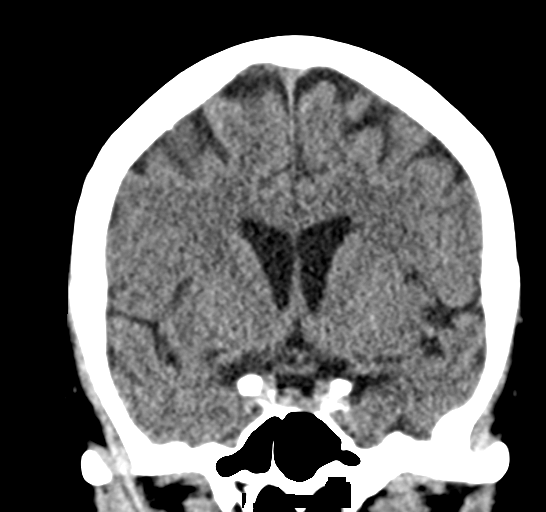
[im 37/67  brain]
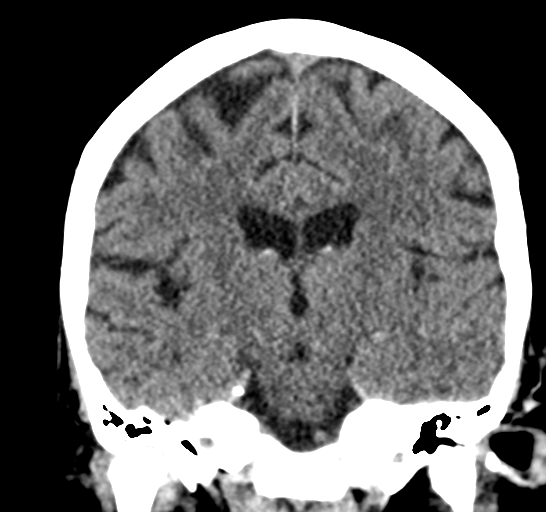

[Series 5: sag soft · sagittal · 0.29mm/px · 3 of 54 slices shown]
[im 18/54  brain]
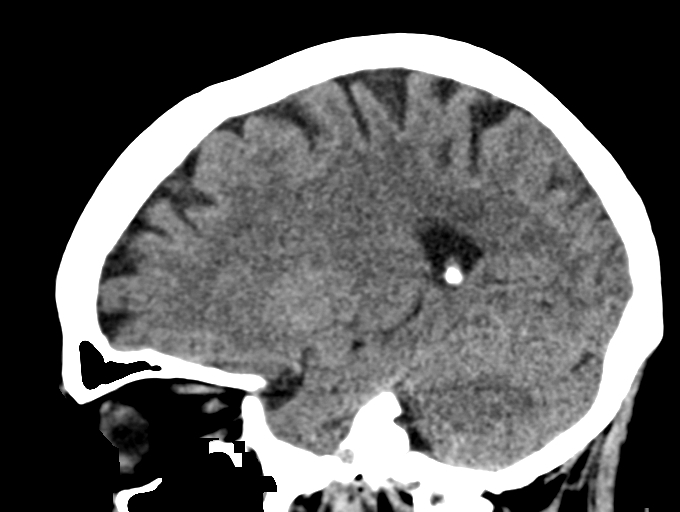
[im 27/54  brain]
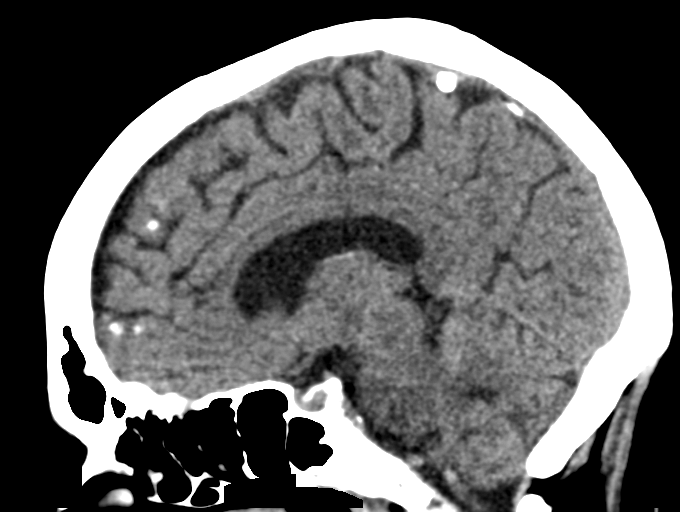
[im 36/54  brain]
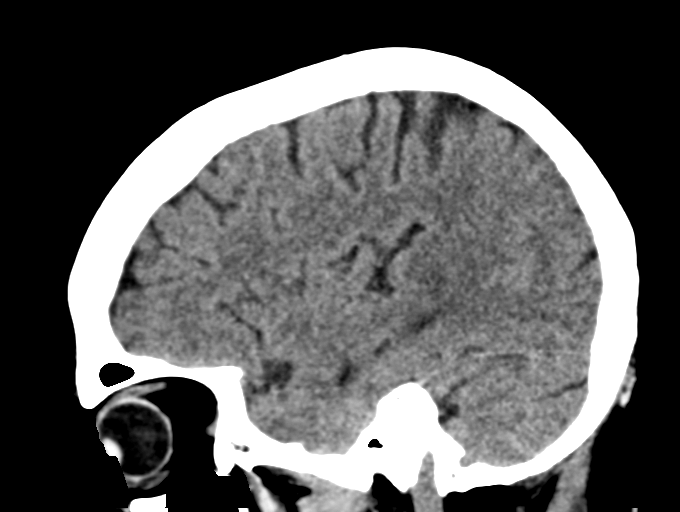

[15 of 47 positions shown; findings below may reference images not displayed]

FINDINGS: Brain: No definite acute territorial infarction, hemorrhage, or
intracranial mass is seen. Mild atrophy. Nonenlarged ventricles.

Vascular: No hyperdense vessels. Scattered calcifications at the
carotid siphons.

Skull: No fracture or suspicious bone lesion

Sinuses/Orbits: Minimal mucosal thickening in the sphenoid and
ethmoid sinuses. No acute orbital abnormality.

Other: None
IMPRESSION: No definite CT evidence for acute intracranial abnormality. MRI
follow-up as clinically indicated.

## 2018-05-06 DIAGNOSIS — E7800 Pure hypercholesterolemia, unspecified: Secondary | ICD-10-CM | POA: Diagnosis not present

## 2018-05-06 DIAGNOSIS — E1142 Type 2 diabetes mellitus with diabetic polyneuropathy: Secondary | ICD-10-CM | POA: Diagnosis not present

## 2018-05-07 DIAGNOSIS — E1142 Type 2 diabetes mellitus with diabetic polyneuropathy: Secondary | ICD-10-CM | POA: Diagnosis not present

## 2018-05-07 DIAGNOSIS — G43809 Other migraine, not intractable, without status migrainosus: Secondary | ICD-10-CM | POA: Diagnosis not present

## 2018-05-07 DIAGNOSIS — Z0000 Encounter for general adult medical examination without abnormal findings: Secondary | ICD-10-CM | POA: Diagnosis not present

## 2018-05-07 DIAGNOSIS — I779 Disorder of arteries and arterioles, unspecified: Secondary | ICD-10-CM | POA: Diagnosis not present

## 2018-05-07 DIAGNOSIS — F411 Generalized anxiety disorder: Secondary | ICD-10-CM | POA: Diagnosis not present

## 2018-05-07 DIAGNOSIS — E7800 Pure hypercholesterolemia, unspecified: Secondary | ICD-10-CM | POA: Diagnosis not present

## 2018-05-29 DIAGNOSIS — H2511 Age-related nuclear cataract, right eye: Secondary | ICD-10-CM | POA: Diagnosis not present

## 2018-05-29 DIAGNOSIS — E113312 Type 2 diabetes mellitus with moderate nonproliferative diabetic retinopathy with macular edema, left eye: Secondary | ICD-10-CM | POA: Diagnosis not present

## 2018-05-29 DIAGNOSIS — H35342 Macular cyst, hole, or pseudohole, left eye: Secondary | ICD-10-CM | POA: Diagnosis not present

## 2018-05-29 DIAGNOSIS — H35352 Cystoid macular degeneration, left eye: Secondary | ICD-10-CM | POA: Diagnosis not present

## 2018-06-04 DIAGNOSIS — N6325 Unspecified lump in the left breast, overlapping quadrants: Secondary | ICD-10-CM | POA: Diagnosis not present

## 2019-05-28 ENCOUNTER — Encounter (HOSPITAL_COMMUNITY): Payer: Self-pay | Admitting: Emergency Medicine

## 2019-05-28 ENCOUNTER — Other Ambulatory Visit: Payer: Self-pay

## 2019-05-28 ENCOUNTER — Emergency Department (HOSPITAL_COMMUNITY): Payer: Medicare HMO

## 2019-05-28 ENCOUNTER — Emergency Department (HOSPITAL_COMMUNITY)
Admission: EM | Admit: 2019-05-28 | Discharge: 2019-05-28 | Disposition: A | Discharge status: Home / Self Care | Payer: Medicare HMO | Attending: Emergency Medicine | Admitting: Emergency Medicine

## 2019-05-28 DIAGNOSIS — S199XXA Unspecified injury of neck, initial encounter: Secondary | ICD-10-CM | POA: Diagnosis not present

## 2019-05-28 DIAGNOSIS — W19XXXA Unspecified fall, initial encounter: Secondary | ICD-10-CM | POA: Diagnosis not present

## 2019-05-28 DIAGNOSIS — Z7901 Long term (current) use of anticoagulants: Secondary | ICD-10-CM | POA: Diagnosis not present

## 2019-05-28 DIAGNOSIS — Z8709 Personal history of other diseases of the respiratory system: Secondary | ICD-10-CM | POA: Insufficient documentation

## 2019-05-28 DIAGNOSIS — Y999 Unspecified external cause status: Secondary | ICD-10-CM | POA: Diagnosis not present

## 2019-05-28 DIAGNOSIS — Y9389 Activity, other specified: Secondary | ICD-10-CM | POA: Insufficient documentation

## 2019-05-28 DIAGNOSIS — E119 Type 2 diabetes mellitus without complications: Secondary | ICD-10-CM | POA: Diagnosis not present

## 2019-05-28 DIAGNOSIS — M25552 Pain in left hip: Secondary | ICD-10-CM | POA: Diagnosis not present

## 2019-05-28 DIAGNOSIS — M79672 Pain in left foot: Secondary | ICD-10-CM | POA: Diagnosis not present

## 2019-05-28 DIAGNOSIS — S0990XA Unspecified injury of head, initial encounter: Secondary | ICD-10-CM | POA: Diagnosis not present

## 2019-05-28 DIAGNOSIS — S0003XA Contusion of scalp, initial encounter: Secondary | ICD-10-CM | POA: Diagnosis not present

## 2019-05-28 DIAGNOSIS — S40211A Abrasion of right shoulder, initial encounter: Secondary | ICD-10-CM | POA: Insufficient documentation

## 2019-05-28 DIAGNOSIS — R0902 Hypoxemia: Secondary | ICD-10-CM | POA: Diagnosis not present

## 2019-05-28 DIAGNOSIS — S79911A Unspecified injury of right hip, initial encounter: Secondary | ICD-10-CM | POA: Diagnosis not present

## 2019-05-28 DIAGNOSIS — W109XXA Fall (on) (from) unspecified stairs and steps, initial encounter: Secondary | ICD-10-CM | POA: Insufficient documentation

## 2019-05-28 DIAGNOSIS — M25519 Pain in unspecified shoulder: Secondary | ICD-10-CM | POA: Diagnosis not present

## 2019-05-28 DIAGNOSIS — S79912A Unspecified injury of left hip, initial encounter: Secondary | ICD-10-CM | POA: Diagnosis not present

## 2019-05-28 DIAGNOSIS — M25551 Pain in right hip: Secondary | ICD-10-CM | POA: Insufficient documentation

## 2019-05-28 DIAGNOSIS — S99922A Unspecified injury of left foot, initial encounter: Secondary | ICD-10-CM | POA: Diagnosis not present

## 2019-05-28 DIAGNOSIS — M542 Cervicalgia: Secondary | ICD-10-CM | POA: Diagnosis not present

## 2019-05-28 DIAGNOSIS — M25511 Pain in right shoulder: Secondary | ICD-10-CM | POA: Diagnosis not present

## 2019-05-28 DIAGNOSIS — Y92019 Unspecified place in single-family (private) house as the place of occurrence of the external cause: Secondary | ICD-10-CM | POA: Diagnosis not present

## 2019-05-28 DIAGNOSIS — M79621 Pain in right upper arm: Secondary | ICD-10-CM | POA: Insufficient documentation

## 2019-05-28 DIAGNOSIS — S4991XA Unspecified injury of right shoulder and upper arm, initial encounter: Secondary | ICD-10-CM | POA: Diagnosis not present

## 2019-05-28 DIAGNOSIS — T07XXXA Unspecified multiple injuries, initial encounter: Secondary | ICD-10-CM | POA: Diagnosis present

## 2019-05-28 DIAGNOSIS — I10 Essential (primary) hypertension: Secondary | ICD-10-CM | POA: Diagnosis not present

## 2019-05-28 DIAGNOSIS — M79601 Pain in right arm: Secondary | ICD-10-CM | POA: Diagnosis not present

## 2019-05-28 DIAGNOSIS — Z7984 Long term (current) use of oral hypoglycemic drugs: Secondary | ICD-10-CM | POA: Insufficient documentation

## 2019-05-28 MED ORDER — LIDOCAINE 5 % EX PTCH: 1.0000 | MEDICATED_PATCH | CUTANEOUS | 0 refills | Status: AC

## 2019-05-28 MED ORDER — ACETAMINOPHEN 325 MG PO TABS
650.0000 mg | ORAL_TABLET | Freq: Once | ORAL | Status: AC
  Administered 2019-05-28: 650 mg via ORAL
  Filled 2019-05-28: qty 2

## 2019-05-28 NOTE — ED Provider Notes (Signed)
[facility] EMERGENCY DEPARTMENT Provider Note   CSN: [id number] Arrival date & time: 05/28/19  2028     History Chief Complaint  Patient presents with  . Fall    Anna Norris is a 79 y.o. female.  HPI      Anna Norris is a 79 y.o. female, with a history of DM and asthma, presenting to the ED for evaluation following a fall that occurred shortly prior to arrival. Patient was in her home and had her hands full, stepped off the top of her carpeted steps, and fell down 14 stairs. She complains of pain to the neck, right shoulder, bilateral hips, and left foot.  These areas of pain are described as a soreness, moderate, generally nonradiating.  Denies LOC, numbness, weakness, chest pain, shortness of breath, nausea/vomiting, abdominal pain, facial injury, back pain, or any other complaints or injuries.    Past Medical History:  Diagnosis Date  . Asthma   . Complication of anesthesia    can not have novacain  . Diabetes mellitus    A1C < 7  . Headache   . Tachycardia    no problems now.  . Vision abnormalities    hole in left eye    Patient Active Problem List   Diagnosis Date Noted  . Headache 05/08/2013  . Aphasia 05/08/2013  . Other and unspecified hyperlipidemia 08/14/2012  . TIA (transient ischemic attack) 04/26/2012  . DM2 (diabetes mellitus, type 2) ([place]) 04/26/2012  . Altered mental status 04/26/2012    Past Surgical History:  Procedure Laterality Date  . BREAST LUMPECTOMY    . FOOT SURGERY    . HAND SURGERY    . TUBAL LIGATION    . uterine cyst       OB History   No obstetric history on file.     Family History  Problem Relation Age of Onset  . Heart failure Father   . Lymphoma Brother   . Stroke Neg Hx     Social History   Tobacco Use  . Smoking status: Never Smoker  . Smokeless tobacco: Never Used  Substance Use Topics  . Alcohol use: Yes    Comment: once a year  . Drug use: No    Home Medications Prior to  Admission medications   Medication Sig Start Date End Date Taking? Authorizing Provider  acetaminophen (TYLENOL) 500 MG tablet Take 500 mg by mouth daily as needed for headache.    [provider]  albuterol (PROVENTIL HFA;VENTOLIN HFA) 108 (90 Base) MCG/ACT inhaler Inhale into the lungs. 10/07/12   [provider]  clopidogrel (PLAVIX) 75 MG tablet Take 1 tablet (75 mg total) by mouth daily. 12/12/16   Garvin Fila, MD  Difluprednate 0.05 % EMUL One drop in operative eye BID starting after surgery for 3 weeks 11/06/17   [provider]  gabapentin (NEURONTIN) 300 MG capsule  10/19/16   [provider]  glimepiride (AMARYL) 2 MG tablet Take 2 mg by mouth daily before breakfast.    [provider]  lidocaine (LIDODERM) 5 %  10/18/16   [provider]  lidocaine (LIDODERM) 5 % Place 1 patch onto the skin daily. Remove & Discard patch within 12 hours or as directed by MD 05/28/19   Reginal Wojcicki C, PA-C  metFORMIN (GLUCOPHAGE) 500 MG tablet Take 1,000 mg by mouth 2 (two) times daily with a meal.    [provider]  ondansetron (ZOFRAN ODT) 4 MG  disintegrating tablet Take 1 tablet (4 mg total) by mouth every 8 (eight) hours as needed for nausea or vomiting. 12/12/16   Garvin Fila, MD  pioglitazone (ACTOS) 15 MG tablet TAKE 1 TABLET BY MOUTH ONCE DAILY FOR 30 DAYS 09/28/17   [provider]  PROAIR HFA 108 (90 BASE) MCG/ACT inhaler Inhale 2 puffs into the lungs every 4 (four) hours as needed.  10/07/12   [provider]    Allergies    Other, Shellfish allergy, Strawberry extract, and Yellow dyes (non-tartrazine)  Review of Systems   Review of Systems  Constitutional: Negative for chills, diaphoresis and fever.  Respiratory: Negative for cough and shortness of breath.   Cardiovascular: Negative for chest pain.  Gastrointestinal: Negative for abdominal pain, nausea and vomiting.  Musculoskeletal: Positive for arthralgias.  Negative for back pain.  Skin: Positive for wound.  Neurological: Negative for dizziness, syncope, weakness, light-headedness, numbness and headaches.  All other systems reviewed and are negative.   Physical Exam Updated Vital Signs BP (!) 217/82 (BP Location: Left Arm)   Pulse 77   Temp 97.8 F (36.6 C) (Oral)   Resp 18   Ht 5\' 6"  (1.676 m)   Wt 72.6 kg   SpO2 97%   BMI 25.82 kg/m   Physical Exam Vitals and nursing note reviewed.  Constitutional:      General: She is not in acute distress.    Appearance: She is well-developed. She is not diaphoretic.     Interventions: Cervical collar in place.  HENT:     Head: Normocephalic.     Comments: Small area of swelling and tenderness to the left parietal region of the scalp.  No noted wound, deformity, or instability. No other area of tenderness, swelling, deformity, or instability to the rest of the scalp or to the face.    Nose: Nose normal.     Mouth/Throat:     Mouth: Mucous membranes are moist.     Pharynx: Oropharynx is clear.  Eyes:     Extraocular Movements: Extraocular movements intact.     Conjunctiva/sclera: Conjunctivae normal.     Pupils: Pupils are equal, round, and reactive to light.  Neck:      Comments: Patient arrives in a c-collar.  She has tenderness in the area indicated without swelling, deformity, or noted instability.  Normal motor function intact in all extremities. No other  midline spinal tenderness.  Cardiovascular:     Rate and Rhythm: Normal rate and regular rhythm.     Pulses: Normal pulses.          Radial pulses are 2+ on the right side and 2+ on the left side.       Dorsalis pedis pulses are 2+ on the right side and 2+ on the left side.       Posterior tibial pulses are 2+ on the right side and 2+ on the left side.     Heart sounds: Normal heart sounds.     Comments: Tactile temperature in the extremities appropriate and equal bilaterally. Pulmonary:     Effort: Pulmonary effort is  normal. No respiratory distress.     Breath sounds: Normal breath sounds.  Abdominal:     Palpations: Abdomen is soft.     Tenderness: There is no abdominal tenderness. There is no guarding.  Musculoskeletal:     Cervical back: Neck supple. Spinous process tenderness and muscular tenderness present.     Right lower leg: No edema.  Left lower leg: No edema.     Comments: Tenderness and abrasion to the right superior and anterior shoulder without swelling, deformity, or instability.  Tenderness extends into the right humerus. Full range of motion in the right elbow and wrist without pain, swelling, deformity, or instability.  Pain and tenderness to bilateral hips, worse on the right.  No noted deformity, instability, swelling, or color change.  Tenderness to the dorsal left foot into the left big toe without deformity, instability, or swelling.  Overall trauma exam performed without any abnormalities noted other than those mentioned.   Lymphadenopathy:     Cervical: No cervical adenopathy.  Skin:    General: Skin is warm and dry.     Capillary Refill: Capillary refill takes less than 2 seconds.     Comments: Abrasion to the right forearm without surrounding tenderness, swelling, deformity, or instability.  Neurological:     Mental Status: She is alert and oriented to person, place, and time.     Comments: No noted acute cognitive deficit. Sensation grossly intact to light touch in the extremities.   Grip strengths equal bilaterally.   Strength 5/5 in all extremities.  Cranial nerves III-XII grossly intact.  Handles oral secretions without noted difficulty.  No noted phonation or speech deficit. No facial droop.   Psychiatric:        Mood and Affect: Mood and affect normal.        Speech: Speech normal.        Behavior: Behavior normal.     ED Results / Procedures / Treatments   Labs (all labs ordered are listed, but only abnormal results are displayed) Labs Reviewed - No  data to display  EKG None  Radiology DG Shoulder Right  Result Date: 05/28/2019 CLINICAL DATA:  79 year old female status post fall with pain. EXAM: RIGHT SHOULDER - 2+ VIEW COMPARISON:  Chest radiographs 11/26/2017 and earlier. FINDINGS: No glenohumeral joint dislocation. Proximal right humerus appears intact. Bone mineralization is within normal limits. Degenerative glenoid subchondral cysts. Chronic degenerative spurring at the right [facility] joint. Negative visible right ribs and chest. IMPRESSION: No acute fracture or dislocation identified about the right shoulder. Electronically Signed   By: Genevie Ann M.D.   On: 05/28/2019 22:04   CT Head Wo Contrast  Result Date: 05/28/2019 CLINICAL DATA:  [place] down at home EXAM: CT HEAD WITHOUT CONTRAST CT CERVICAL SPINE WITHOUT CONTRAST TECHNIQUE: Multidetector CT imaging of the head and cervical spine was performed following the standard protocol without intravenous contrast. Multiplanar CT image reconstructions of the cervical spine were also generated. COMPARISON:  CT brain and cervical spine 04/25/2012 FINDINGS: CT HEAD FINDINGS Brain: No acute territorial infarction, hemorrhage or intracranial mass. Hyperdensity along the right frontal convexity suspected artifact. Nonenlarged ventricles Vascular: No hyperdense vessels.  Carotid vascular calcification Skull: Normal. Negative for fracture or focal lesion. Sinuses/Orbits: No acute finding. Mild mucosal thickening in the ethmoid sinuses. Other: None CT CERVICAL SPINE FINDINGS Alignment: Reversal of cervical lordosis. No subluxation. Facet alignment within normal limits. Skull base and vertebrae: No acute fracture. No primary bone lesion or focal pathologic process. Soft tissues and spinal canal: No prevertebral fluid or swelling. No visible canal hematoma. Disc levels: Moderate degenerative changes C5-C6 and C6-C7. Facet degenerative changes at multiple levels. Upper chest: Negative. Other: None IMPRESSION: 1. No CT  evidence for acute intracranial abnormality. 2. Bursal of cervical lordosis with degenerative change. No acute osseous abnormality. Electronically Signed   By: Madie Reno.D.  On: 05/28/2019 22:32   CT Cervical Spine Wo Contrast  Result Date: 05/28/2019 CLINICAL DATA:  [place] at home EXAM: CT HEAD WITHOUT CONTRAST CT CERVICAL SPINE WITHOUT CONTRAST TECHNIQUE: Multidetector CT imaging of the head and cervical spine was performed following the standard protocol without intravenous contrast. Multiplanar CT image reconstructions of the cervical spine were also generated. COMPARISON:  CT brain and cervical spine 04/25/2012 FINDINGS: CT HEAD FINDINGS Brain: No acute territorial infarction, hemorrhage or intracranial mass. Hyperdensity along the right frontal convexity suspected artifact. Nonenlarged ventricles Vascular: No hyperdense vessels.  Carotid vascular calcification Skull: Normal. Negative for fracture or focal lesion. Sinuses/Orbits: No acute finding. Mild mucosal thickening in the ethmoid sinuses. Other: None CT CERVICAL SPINE FINDINGS Alignment: Reversal of cervical lordosis. No subluxation. Facet alignment within normal limits. Skull base and vertebrae: No acute fracture. No primary bone lesion or focal pathologic process. Soft tissues and spinal canal: No prevertebral fluid or swelling. No visible canal hematoma. Disc levels: Moderate degenerative changes C5-C6 and C6-C7. Facet degenerative changes at multiple levels. Upper chest: Negative. Other: None IMPRESSION: 1. No CT evidence for acute intracranial abnormality. 2. Bursal of cervical lordosis with degenerative change. No acute osseous abnormality. Electronically Signed   By: Donavan Foil M.D.   On: 05/28/2019 22:32   DG Humerus Right  Result Date: 05/28/2019 CLINICAL DATA:  Recent fall with right arm pain, initial encounter EXAM: RIGHT HUMERUS - 2+ VIEW COMPARISON:  None. FINDINGS: Degenerative changes of the acromioclavicular joint are  seen. No acute fracture or dislocation is noted. IMPRESSION: Degenerative change about the shoulder joint without acute abnormality. Electronically Signed   By: Inez Catalina M.D.   On: 05/28/2019 22:09   DG Foot Complete Left  Result Date: 05/28/2019 CLINICAL DATA:  79 year old female status post fall with pain. EXAM: LEFT FOOT - COMPLETE 3+ VIEW COMPARISON:  Left foot series 04/25/2012. FINDINGS: Bone mineralization is within normal limits for age. Stable joint spaces and alignment. Calcaneus intact. No fracture or dislocation identified. No discrete soft tissue injury. IMPRESSION: No acute fracture or dislocation identified about the left foot. Electronically Signed   By: Genevie Ann M.D.   On: 05/28/2019 22:05   DG Hips Bilat W or Wo Pelvis 3-4 Views  Result Date: 05/28/2019 CLINICAL DATA:  Recent fall with hip pain and tenderness, initial encounter EXAM: DG HIP (WITH OR WITHOUT PELVIS) 4V BILAT COMPARISON:  None. FINDINGS: Pelvic ring is intact. Degenerative changes of the lumbar spine are seen. No acute fracture or dislocation is noted. No other focal abnormality is seen. IMPRESSION: No acute abnormality noted. Electronically Signed   By: Inez Catalina M.D.   On: 05/28/2019 22:07    Procedures Procedures (including critical care time)  Medications Ordered in ED Medications  acetaminophen (TYLENOL) tablet 650 mg (has no administration in time range)    ED Course  I have reviewed the triage vital signs and the nursing notes.  Pertinent labs & imaging results that were available during my care of the patient were reviewed by me and considered in my medical decision making (see chart for details).    MDM Rules/Calculators/A&P                      Patient presents for evaluation following a mechanical fall.  No focal neurologic deficits. No acute abnormalities on imaging studies.  Advised patient to follow-up with PCP for any further management. I personally reviewed and interpreted her  imaging studies. The patient was  given instructions for home care as well as return precautions. Patient voices understanding of these instructions, accepts the plan, and is comfortable with discharge.   Findings and plan of care discussed with Adrian Prows, MD. Dr. Vanita Panda personally evaluated and examined this patient.  Vitals:   05/28/19 2032 05/28/19 2035 05/28/19 2045 05/28/19 2115  BP: (!) 217/82  (!) 192/81 (!) 188/76  Pulse: 77  70 72  Resp: 18  16 15   Temp: 97.8 F (36.6 C)     TempSrc: Oral     SpO2: 97%  97% 95%  Weight:  72.6 kg    Height:  5\' 6"  (1.676 m)      Final Clinical Impression(s) / ED Diagnoses Final diagnoses:  Fall, initial encounter    Rx / DC Orders ED Discharge Orders         Ordered    lidocaine (LIDODERM) 5 %  Every 24 hours     05/28/19 2329           Lorayne Bender, PA-C 05/28/19 2340    Carmin Muskrat, MD 05/29/19 0028

## 2019-05-28 NOTE — Discharge Instructions (Addendum)
  Expect your soreness to increase over the next 2-3 days. Take it easy, but do not lay around too much as this may make any stiffness worse.  Acetaminophen: May take acetaminophen (generic for Tylenol), as needed, for pain. Your daily total maximum amount of acetaminophen from all sources should be limited to 4000mg /day for persons without liver problems, or 2000mg /day for those with liver problems. Lidocaine patches: These are available via either prescription or over-the-counter. The over-the-counter option may be more economical one and are likely just as effective. There are multiple over-the-counter brands, such as Salonpas. Ice: May apply ice to the area over the next 24 hours for 15 minutes at a time to reduce pain, inflammation, and swelling, if present. Exercises: Be sure to perform the attached exercises starting with three times a week and working up to performing them daily. This is an essential part of preventing long term problems.  Follow up: Follow up with a primary care provider for any future management of these complaints. Be sure to follow up within 7-10 days. Return: Return to the ED should symptoms worsen.  For prescription assistance, may try using prescription discount sites or apps, such as goodrx.com

## 2019-05-28 NOTE — ED Notes (Signed)
Pt ambulated in the hall with no assistance. Steady gait noted. Pt complained of slight discomfort in left foot, but said it had been looked at.

## 2019-05-28 NOTE — ED Triage Notes (Signed)
  Patient BIB EMS after falling down steps at home.  Patient was coming downstairs and missed one of the steps and fell down 14 steps.  Patient hit head, R shoulder, bilateral hips, and L foot.  No deformity noted.  Pain 4/10.  Patient is A&O x4.  Patient is on Plavix.

## 2019-06-03 DIAGNOSIS — W19XXXA Unspecified fall, initial encounter: Secondary | ICD-10-CM | POA: Diagnosis not present

## 2019-06-03 DIAGNOSIS — S41111A Laceration without foreign body of right upper arm, initial encounter: Secondary | ICD-10-CM | POA: Diagnosis not present

## 2019-06-03 DIAGNOSIS — Z0000 Encounter for general adult medical examination without abnormal findings: Secondary | ICD-10-CM | POA: Diagnosis not present

## 2019-06-03 DIAGNOSIS — Z79899 Other long term (current) drug therapy: Secondary | ICD-10-CM | POA: Diagnosis not present

## 2019-06-03 DIAGNOSIS — E7800 Pure hypercholesterolemia, unspecified: Secondary | ICD-10-CM | POA: Diagnosis not present

## 2019-06-03 DIAGNOSIS — E1142 Type 2 diabetes mellitus with diabetic polyneuropathy: Secondary | ICD-10-CM | POA: Diagnosis not present

## 2019-06-03 DIAGNOSIS — I779 Disorder of arteries and arterioles, unspecified: Secondary | ICD-10-CM | POA: Diagnosis not present

## 2019-07-07 DIAGNOSIS — E119 Type 2 diabetes mellitus without complications: Secondary | ICD-10-CM | POA: Diagnosis not present

## 2019-07-07 DIAGNOSIS — R0781 Pleurodynia: Secondary | ICD-10-CM | POA: Diagnosis not present

## 2019-07-07 DIAGNOSIS — W19XXXA Unspecified fall, initial encounter: Secondary | ICD-10-CM | POA: Diagnosis not present

## 2019-08-28 DIAGNOSIS — R112 Nausea with vomiting, unspecified: Secondary | ICD-10-CM | POA: Diagnosis not present

## 2019-08-28 DIAGNOSIS — E1142 Type 2 diabetes mellitus with diabetic polyneuropathy: Secondary | ICD-10-CM | POA: Diagnosis not present

## 2019-08-28 DIAGNOSIS — R030 Elevated blood-pressure reading, without diagnosis of hypertension: Secondary | ICD-10-CM | POA: Diagnosis not present

## 2019-08-28 DIAGNOSIS — R0789 Other chest pain: Secondary | ICD-10-CM | POA: Diagnosis not present

## 2019-09-10 DIAGNOSIS — R112 Nausea with vomiting, unspecified: Secondary | ICD-10-CM | POA: Diagnosis not present

## 2019-09-10 DIAGNOSIS — R079 Chest pain, unspecified: Secondary | ICD-10-CM | POA: Diagnosis not present

## 2019-12-05 DIAGNOSIS — Z1231 Encounter for screening mammogram for malignant neoplasm of breast: Secondary | ICD-10-CM | POA: Diagnosis not present

## 2019-12-08 DIAGNOSIS — I10 Essential (primary) hypertension: Secondary | ICD-10-CM | POA: Diagnosis not present

## 2019-12-08 DIAGNOSIS — M25519 Pain in unspecified shoulder: Secondary | ICD-10-CM | POA: Diagnosis not present

## 2019-12-08 DIAGNOSIS — E1142 Type 2 diabetes mellitus with diabetic polyneuropathy: Secondary | ICD-10-CM | POA: Diagnosis not present

## 2019-12-22 ENCOUNTER — Ambulatory Visit (INDEPENDENT_AMBULATORY_CARE_PROVIDER_SITE_OTHER): Payer: Medicare HMO | Admitting: Ophthalmology

## 2019-12-22 ENCOUNTER — Other Ambulatory Visit: Payer: Self-pay

## 2019-12-22 ENCOUNTER — Encounter (INDEPENDENT_AMBULATORY_CARE_PROVIDER_SITE_OTHER): Payer: Self-pay | Admitting: Ophthalmology

## 2019-12-22 DIAGNOSIS — H35342 Macular cyst, hole, or pseudohole, left eye: Secondary | ICD-10-CM

## 2019-12-22 DIAGNOSIS — H353131 Nonexudative age-related macular degeneration, bilateral, early dry stage: Secondary | ICD-10-CM | POA: Insufficient documentation

## 2019-12-22 DIAGNOSIS — H35352 Cystoid macular degeneration, left eye: Secondary | ICD-10-CM

## 2019-12-22 DIAGNOSIS — E113312 Type 2 diabetes mellitus with moderate nonproliferative diabetic retinopathy with macular edema, left eye: Secondary | ICD-10-CM | POA: Insufficient documentation

## 2019-12-22 HISTORY — DX: Cystoid macular degeneration, left eye: H35.352

## 2019-12-22 NOTE — Patient Instructions (Signed)
-  Report any new onset visual acuity declines or distortions

## 2019-12-22 NOTE — Progress Notes (Signed)
12/22/2019     CHIEF COMPLAINT Patient presents for Retina Follow Up   HISTORY OF PRESENT ILLNESS: Anna Norris is a 79 y.o. female who presents to the clinic today for:   HPI    Retina Follow Up    Patient presents with  Diabetic Retinopathy.  In left eye.  This started 1.5 years ago.  Severity is mild.  Duration of 1.5 years.  Since onset it is stable.          Comments    1.5 Year Diabetic F/U OU (over due for Avastin OS)  Pt denies any changes to [place] OU since last visit over a year and a half ago. Pt denies new symptoms OU. A1c: unknown to pt LBS: unknown to pt.       Last edited by Rockie Neighbours, [place] on 12/22/2019  1:53 PM. (History)      Referring physician: Lawerance Cruel, [place],  [place] [id number]  HISTORICAL INFORMATION:   Selected notes from the MEDICAL RECORD NUMBER    Lab Results  Component Value Date   HGBA1C 7.4 (H) 05/09/2013     CURRENT MEDICATIONS: Current Outpatient Medications (Ophthalmic Drugs)  Medication Sig  . Difluprednate 0.05 % EMUL One drop in operative eye BID starting after surgery for 3 weeks   No current facility-administered medications for this visit. (Ophthalmic Drugs)   Current Outpatient Medications (Other)  Medication Sig  . acetaminophen (TYLENOL) 500 MG tablet Take 500 mg by mouth daily as needed for headache.  . albuterol (PROVENTIL HFA;VENTOLIN HFA) 108 (90 Base) MCG/ACT inhaler Inhale into the lungs.  . clopidogrel (PLAVIX) 75 MG tablet Take 1 tablet (75 mg total) by mouth daily.  Marland Kitchen gabapentin (NEURONTIN) 300 MG capsule   . glimepiride (AMARYL) 2 MG tablet Take 2 mg by mouth daily before breakfast.  . lidocaine (LIDODERM) 5 %   . lidocaine (LIDODERM) 5 % Place 1 patch onto the skin daily. Remove & Discard patch within 12 hours or as directed by MD  . metFORMIN (GLUCOPHAGE) 500 MG tablet Take 1,000 mg by mouth 2 (two) times daily with a meal.  . ondansetron (ZOFRAN ODT) 4 MG disintegrating  tablet Take 1 tablet (4 mg total) by mouth every 8 (eight) hours as needed for nausea or vomiting.  . pioglitazone (ACTOS) 15 MG tablet TAKE 1 TABLET BY MOUTH ONCE DAILY FOR 30 DAYS  . PROAIR HFA 108 (90 BASE) MCG/ACT inhaler Inhale 2 puffs into the lungs every 4 (four) hours as needed.    No current facility-administered medications for this visit. (Other)      REVIEW OF SYSTEMS: ROS    Negative for: Constitutional, Gastrointestinal, Neurological, Skin, Genitourinary, Musculoskeletal, HENT, Endocrine, Cardiovascular, Eyes, Respiratory, Psychiatric, Allergic/Imm, Heme/Lymph   Last edited by Rockie Neighbours, COA on 12/22/2019  1:53 PM. (History)       ALLERGIES Allergies  Allergen Reactions  . Novocain [Procaine]   . Other Other (See Comments)    Mold - headaches   . Shellfish Allergy Swelling  . Strawberry Extract Swelling    Strawberry with shrimp made lips swollen  . Yellow Dyes (Non-Tartrazine) Hives    PAST MEDICAL HISTORY Past Medical History:  Diagnosis Date  . Asthma   . Complication of anesthesia    can not have novacain  . Cystoid macular edema of left eye 12/22/2019  . Diabetes mellitus    A1C < 7  . Headache   . Tachycardia  no problems now.  . Vision abnormalities    hole in left eye   Past Surgical History:  Procedure Laterality Date  . BREAST LUMPECTOMY    . FOOT SURGERY    . HAND SURGERY    . TUBAL LIGATION    . uterine cyst      FAMILY HISTORY Family History  Problem Relation Age of Onset  . Heart failure Father   . Lymphoma Brother   . Stroke Neg Hx     SOCIAL HISTORY Social History   Tobacco Use  . Smoking status: Never Smoker  . Smokeless tobacco: Never Used  Substance Use Topics  . Alcohol use: Yes    Comment: once a year  . Drug use: No         OPHTHALMIC EXAM: Base Eye Exam    Visual Acuity (ETDRS)      Right Left   Dist [place] 20/25 20/40   Dist ph [place]  NI       Tonometry (Tonopen, 1:55 PM)      Right Left    Pressure 17 14       Pupils      Pupils Dark Light Shape React APD   Right PERRL 4 3 Round Brisk None   Left PERRL 4 3 Round Brisk None       Visual Fields (Counting fingers)      Left Right    Full Full       Extraocular Movement      Right Left    Full Full       Neuro/Psych    Oriented x3: Yes   Mood/Affect: Normal       Dilation    Both eyes: 1.0% Mydriacyl, 2.5% Phenylephrine @ 1:58 PM        Slit Lamp and Fundus Exam    External Exam      Right Left   External Normal Normal       Slit Lamp Exam      Right Left   Lids/Lashes Normal Normal   Conjunctiva/Sclera White and quiet White and quiet   Cornea Clear Clear   Anterior Chamber Deep and quiet Deep and quiet   Iris Round and reactive Round and reactive   Lens Centered posterior chamber intraocular lens Centered posterior chamber intraocular lens   Anterior Vitreous Normal Normal       Fundus Exam      Right Left   Posterior Vitreous Normal Normal   Disc Normal Normal   C/D Ratio 0.4 0.4   Macula Intermediate age related macular degeneration, Hard drusen, no hemorrhage, no macular thickening, no disciform scar Normal   Vessels NPDR- Moderate NPDR- Moderate   Periphery Normal Normal          IMAGING AND PROCEDURES  Imaging and Procedures for 12/22/19  OCT, Retina - OU - Both Eyes       Right Eye Quality was good. Scan locations included subfoveal. Central Foveal Thickness: 287. Progression has been stable. Findings include no IRF, abnormal foveal contour, no SRF, retinal drusen .   Left Eye Quality was good. Scan locations included subfoveal. Central Foveal Thickness: 346. Findings include abnormal foveal contour, retinal drusen , no SRF, no IRF.                 ASSESSMENT/PLAN:  Moderate nonproliferative diabetic retinopathy of left eye with macular edema associated with type 2 diabetes mellitus (HCC) CSME component of the left eye condition has improved  and is resolved at this  time.  Early stage nonexudative age-related macular degeneration of both eyes The nature of age--related macular degeneration was discussed with the patient as well as the distinction between dry and wet types. Checking an [place]ler Grid daily with advice to return immediately should a distortion develop, was given to the patient. The patient 's smoking status now and in the past was determined and advice based on the AREDS study was provided regarding the consumption of antioxidant supplements. AREDS 2 vitamin formulation was recommended. Consumption of dark leafy vegetables and fresh fruits of various colors was recommended. Treatment modalities for wet macular degeneration particularly the use of intravitreal injections of anti-blood vessel growth factors was discussed with the patient. Avastin, Lucentis, and Eylea are the available options. On occasion, therapy includes the use of photodynamic therapy and thermal laser. Stressed to the patient do not rub eyes.  Patient was advised to check Amsler Grid daily and return immediately if changes are noted. Instructions on using the grid were given to the patient. All patient questions were answered.      ICD-10-CM   1. Moderate nonproliferative diabetic retinopathy of left eye with macular edema associated with type 2 diabetes mellitus (HCC)  Y10.1751 OCT, Retina - OU - Both Eyes  2. Early stage nonexudative age-related macular degeneration of both eyes  H35.3131 OCT, Retina - OU - Both Eyes  3. Lamellar macular hole of left eye  H35.342     1. No active macular edema at this time.  2. No active maculopathy OU at this time.  3.  Ophthalmic Meds Ordered this visit:  No orders of the defined types were placed in this encounter.      Return in about 9 months (around 09/20/2020) for DILATE OU, OCT.  Patient Instructions  -Report any new onset visual acuity declines or distortions    Explained the diagnoses, plan, and follow up with the patient  and they expressed understanding.  Patient expressed understanding of the importance of proper follow up care.   Clent Demark Redonna Wilbert M.D. Diseases & Surgery of the Retina and Vitreous [facility] [place] 12/22/19     Abbreviations: M myopia (nearsighted); A astigmatism; H hyperopia (farsighted); P presbyopia; Mrx spectacle prescription;  CTL contact lenses; OD right eye; OS left eye; OU both eyes  XT exotropia; ET esotropia; PEK punctate epithelial keratitis; PEE punctate epithelial erosions; DES dry eye syndrome; MGD meibomian gland dysfunction; ATs artificial tears; PFAT's preservative free artificial tears; [place] nuclear sclerotic cataract; PSC posterior subcapsular cataract; ERM epi-retinal membrane; PVD posterior vitreous detachment; RD retinal detachment; DM diabetes mellitus; DR diabetic retinopathy; NPDR non-proliferative diabetic retinopathy; PDR proliferative diabetic retinopathy; CSME clinically significant macular edema; DME diabetic macular edema; dbh dot blot hemorrhages; CWS cotton wool spot; POAG primary open angle glaucoma; C/D cup-to-disc ratio; HVF humphrey visual field; GVF goldmann visual field; OCT optical coherence tomography; IOP intraocular pressure; BRVO Branch retinal vein occlusion; CRVO central retinal vein occlusion; CRAO central retinal artery occlusion; BRAO branch retinal artery occlusion; RT retinal tear; SB scleral buckle; PPV pars plana vitrectomy; VH Vitreous hemorrhage; PRP panretinal laser photocoagulation; IVK intravitreal kenalog; VMT vitreomacular traction; MH Macular hole;  NVD neovascularization of the disc; NVE neovascularization elsewhere; AREDS age related eye disease study; ARMD age related macular degeneration; POAG primary open angle glaucoma; EBMD epithelial/anterior basement membrane dystrophy; ACIOL anterior chamber intraocular lens; IOL intraocular lens; PCIOL posterior chamber intraocular lens; Phaco/IOL phacoemulsification with intraocular lens  placement; [place] photorefractive keratectomy;  LASIK laser assisted in situ keratomileusis; HTN hypertension; DM diabetes mellitus; COPD chronic obstructive pulmonary disease

## 2019-12-22 NOTE — Assessment & Plan Note (Signed)

## 2019-12-22 NOTE — Assessment & Plan Note (Signed)
CSME component of the left eye condition has improved and is resolved at this time.

## 2019-12-24 DIAGNOSIS — M67911 Unspecified disorder of synovium and tendon, right shoulder: Secondary | ICD-10-CM | POA: Diagnosis not present

## 2019-12-29 DIAGNOSIS — R197 Diarrhea, unspecified: Secondary | ICD-10-CM | POA: Diagnosis not present

## 2019-12-29 DIAGNOSIS — I10 Essential (primary) hypertension: Secondary | ICD-10-CM | POA: Diagnosis not present

## 2020-06-14 ENCOUNTER — Other Ambulatory Visit: Payer: Self-pay | Admitting: Family Medicine

## 2020-06-14 DIAGNOSIS — R946 Abnormal results of thyroid function studies: Secondary | ICD-10-CM

## 2020-06-14 DIAGNOSIS — I779 Disorder of arteries and arterioles, unspecified: Secondary | ICD-10-CM | POA: Diagnosis not present

## 2020-06-14 DIAGNOSIS — Z23 Encounter for immunization: Secondary | ICD-10-CM | POA: Diagnosis not present

## 2020-06-14 DIAGNOSIS — Z0000 Encounter for general adult medical examination without abnormal findings: Secondary | ICD-10-CM | POA: Diagnosis not present

## 2020-06-14 DIAGNOSIS — R011 Cardiac murmur, unspecified: Secondary | ICD-10-CM | POA: Diagnosis not present

## 2020-06-14 DIAGNOSIS — E1142 Type 2 diabetes mellitus with diabetic polyneuropathy: Secondary | ICD-10-CM | POA: Diagnosis not present

## 2020-06-14 DIAGNOSIS — I10 Essential (primary) hypertension: Secondary | ICD-10-CM | POA: Diagnosis not present

## 2020-06-14 DIAGNOSIS — E7800 Pure hypercholesterolemia, unspecified: Secondary | ICD-10-CM | POA: Diagnosis not present

## 2020-06-14 DIAGNOSIS — M8580 Other specified disorders of bone density and structure, unspecified site: Secondary | ICD-10-CM | POA: Diagnosis not present

## 2020-06-16 ENCOUNTER — Ambulatory Visit
Admission: RE | Admit: 2020-06-16 | Discharge: 2020-06-16 | Disposition: A | Discharge status: Home / Self Care | Payer: Medicare HMO | Source: Ambulatory Visit | Attending: Family Medicine | Admitting: Family Medicine

## 2020-06-16 DIAGNOSIS — E041 Nontoxic single thyroid nodule: Secondary | ICD-10-CM | POA: Diagnosis not present

## 2020-06-16 DIAGNOSIS — R946 Abnormal results of thyroid function studies: Secondary | ICD-10-CM

## 2020-06-22 ENCOUNTER — Encounter: Payer: Self-pay | Admitting: Podiatry

## 2020-06-22 ENCOUNTER — Ambulatory Visit: Payer: Medicare HMO | Admitting: Podiatry

## 2020-06-22 ENCOUNTER — Other Ambulatory Visit: Payer: Self-pay

## 2020-06-22 DIAGNOSIS — E089 Diabetes mellitus due to underlying condition without complications: Secondary | ICD-10-CM

## 2020-06-22 DIAGNOSIS — L84 Corns and callosities: Secondary | ICD-10-CM

## 2020-06-22 DIAGNOSIS — M2042 Other hammer toe(s) (acquired), left foot: Secondary | ICD-10-CM | POA: Diagnosis not present

## 2020-06-22 DIAGNOSIS — Q828 Other specified congenital malformations of skin: Secondary | ICD-10-CM | POA: Diagnosis not present

## 2020-06-22 NOTE — Progress Notes (Signed)
  Subjective:  Patient ID: Anna Norris, female    DOB: January 08, 1941,  MRN: [id number]  Chief Complaint  Patient presents with  . Callouses    Left foot between 4th and 5th toe    80 y.o. female presents with the above complaint. History confirmed with patient.  She is type 2 diabetes her A1c is 7.9% and they are working getting down.  Has a history of neuroma excision of the left foot  Objective:  Physical Exam: warm, good capillary refill, no trophic changes or ulcerative lesions, normal DP and PT pulses and normal sensory exam. Left Foot: Hammertoe deformities 2 through 5 with adductovarus rotation of the fifth toe, heloma molle on the medial side of the fifth toe PIPJ Assessment:   1. Hammertoe of left foot      Plan:  Patient was evaluated and treated and all questions answered.  Discussed etiology treatment options of heloma molle and hammertoe deformities in detail with her.  We discussed surgical nonsurgical treatment.  At the current point with her A1c at 7.9 % I recommend nonsurgical treatment until she is able to get her A1c closer to 7% to reduce risk of infection or nonhealing of incisions.  Debrided the lesion of hyperkeratosis today and dispense silicone toe crest.  We will see her back in about 6 months and we will review her progress and A1c and plan for surgery in the fall if she has gotten her A1c down.  Return sooner if issues, needs debridement or A1c gets below that before then  Return in about 6 months (around 12/22/2020) for follow up on corn left foot, possible toe surgery to fix it, x-ray at next visit .

## 2020-06-23 ENCOUNTER — Telehealth: Payer: Self-pay | Admitting: Podiatry

## 2020-06-23 NOTE — Telephone Encounter (Signed)
Patient called inquiring about After Visit Summary, stated she was suppose to be seen for callus and corn but the AVS only mentioned callus, Patient was here for trim of corn. I informed patient the referral only listed the corn nothing regarding the actual callus. I offered to schedule patient Anna Norris appointment to take care of callus, stated she would call back at later date to get that appointment scheduled

## 2020-06-23 NOTE — Telephone Encounter (Signed)
The corn and callus are the same thing, between her toes which I shaved down already

## 2020-06-23 NOTE — Telephone Encounter (Signed)
I informed patient, they are the same. Thanks

## 2020-09-21 ENCOUNTER — Encounter (INDEPENDENT_AMBULATORY_CARE_PROVIDER_SITE_OTHER): Payer: Medicare HMO | Admitting: Ophthalmology

## 2020-10-19 ENCOUNTER — Ambulatory Visit (INDEPENDENT_AMBULATORY_CARE_PROVIDER_SITE_OTHER): Payer: Medicare HMO | Admitting: Ophthalmology

## 2020-10-19 ENCOUNTER — Other Ambulatory Visit: Payer: Self-pay

## 2020-10-19 DIAGNOSIS — E113312 Type 2 diabetes mellitus with moderate nonproliferative diabetic retinopathy with macular edema, left eye: Secondary | ICD-10-CM | POA: Diagnosis not present

## 2020-10-19 DIAGNOSIS — H2511 Age-related nuclear cataract, right eye: Secondary | ICD-10-CM

## 2020-10-19 DIAGNOSIS — H353131 Nonexudative age-related macular degeneration, bilateral, early dry stage: Secondary | ICD-10-CM | POA: Diagnosis not present

## 2020-10-19 NOTE — Assessment & Plan Note (Addendum)
Patient reports not so good blood sugar control.  Blood sugar control is been encouraged to prevent and slow progression of diabetic retinopathy  Stable OU

## 2020-10-19 NOTE — Assessment & Plan Note (Signed)
OS OD, no signs of CNVM

## 2020-10-19 NOTE — Assessment & Plan Note (Signed)
Moderate cataract, progressive accounts for acuity follow-up with Dr. Rutherford Guys, likely would benefit from surgery

## 2020-10-19 NOTE — Progress Notes (Signed)
10/19/2020     CHIEF COMPLAINT Patient presents for Retina Evaluation   HISTORY OF PRESENT ILLNESS: Anna Norris is a 80 y.o. female who presents to the clinic today for:   HPI   History of moderate nonproliferative diabetic retinopathy in each eye.  No change in acuity over the last 10 months Last edited by Hurman Horn, MD on 10/19/2020 10:35 AM.      Referring physician: Rutherford Guys, [place],  [place] [address]  HISTORICAL INFORMATION:   Selected notes from the MEDICAL RECORD NUMBER    Lab Results  Component Value Date   HGBA1C 7.4 (H) 05/09/2013     CURRENT MEDICATIONS: Current Outpatient Medications (Ophthalmic Drugs)  Medication Sig   Difluprednate 0.05 % EMUL One drop in operative eye BID starting after surgery for 3 weeks   No current facility-administered medications for this visit. (Ophthalmic Drugs)   Current Outpatient Medications (Other)  Medication Sig   acetaminophen (TYLENOL) 500 MG tablet Take 500 mg by mouth daily as needed for headache.   albuterol (PROVENTIL HFA;VENTOLIN HFA) 108 (90 Base) MCG/ACT inhaler Inhale into the lungs.   clopidogrel (PLAVIX) 75 MG tablet Take 1 tablet (75 mg total) by mouth daily.   gabapentin (NEURONTIN) 300 MG capsule    glimepiride (AMARYL) 2 MG tablet Take 2 mg by mouth daily before breakfast.   lidocaine (LIDODERM) 5 %    lidocaine (LIDODERM) 5 % Place 1 patch onto the skin daily. Remove & Discard patch within 12 hours or as directed by MD   metFORMIN (GLUCOPHAGE) 500 MG tablet Take 1,000 mg by mouth 2 (two) times daily with a meal.   ondansetron (ZOFRAN ODT) 4 MG disintegrating tablet Take 1 tablet (4 mg total) by mouth every 8 (eight) hours as needed for nausea or vomiting.   pioglitazone (ACTOS) 15 MG tablet TAKE 1 TABLET BY MOUTH ONCE DAILY FOR 30 DAYS   PROAIR HFA 108 (90 BASE) MCG/ACT inhaler Inhale 2 puffs into the lungs every 4 (four) hours as needed.    No current  facility-administered medications for this visit. (Other)      REVIEW OF SYSTEMS: ROS   Negative for: Constitutional, Gastrointestinal, Neurological, Skin, Genitourinary, Musculoskeletal, HENT, Endocrine, Cardiovascular, Eyes, Respiratory, Psychiatric, Allergic/Imm, Heme/Lymph Last edited by Hurman Horn, MD on 10/19/2020 10:35 AM.       ALLERGIES Allergies  Allergen Reactions   Novocain [Procaine]    Other Other (See Comments)    Mold - headaches    Shellfish Allergy Swelling   Strawberry Extract Swelling    Strawberry with shrimp made lips swollen   Yellow Dyes (Non-Tartrazine) Hives    PAST MEDICAL HISTORY Past Medical History:  Diagnosis Date   Asthma    Complication of anesthesia    can not have novacain   Cystoid macular edema of left eye 12/22/2019   Diabetes mellitus    A1C < 7   Headache    Tachycardia    no problems now.   Vision abnormalities    hole in left eye   Past Surgical History:  Procedure Laterality Date   BREAST LUMPECTOMY     FOOT SURGERY     HAND SURGERY     TUBAL LIGATION     uterine cyst      FAMILY HISTORY Family History  Problem Relation Age of Onset   Heart failure Father    Lymphoma Brother    Stroke Neg Hx  SOCIAL HISTORY Social History   Tobacco Use   Smoking status: Never   Smokeless tobacco: Never  Substance Use Topics   Alcohol use: Yes    Comment: once a year   Drug use: No         OPHTHALMIC EXAM:  Base Eye Exam     Visual Acuity (ETDRS)       Right Left   Dist [place] 20/30 -1 20/40 +1         Tonometry (Tonopen, 10:34 AM)       Right Left   Pressure 18 15         Pupils       Pupils React APD   Right PERRL Brisk None   Left PERRL Brisk None         Visual Fields       Left Right    Full Full         Extraocular Movement       Right Left    Full, Ortho Full, Ortho         Neuro/Psych     Oriented x3: Yes   Mood/Affect: Normal         Dilation     Both eyes:  1.0% Mydriacyl, 2.5% Phenylephrine @ 10:33 AM           Slit Lamp and Fundus Exam     External Exam       Right Left   External Normal Normal         Slit Lamp Exam       Right Left   Lids/Lashes Normal Normal   Conjunctiva/Sclera White and quiet White and quiet   Cornea Clear Clear   Anterior Chamber Deep and quiet Deep and quiet   Iris Round and reactive Round and reactive   Lens 2+ Nuclear sclerosis Centered posterior chamber intraocular lens   Anterior Vitreous Normal Normal         Fundus Exam       Right Left   Posterior Vitreous Normal Normal   Disc Normal Normal   C/D Ratio 0.4 0.4   Macula Intermediate age related macular degeneration, Hard drusen, no hemorrhage, no macular thickening, no disciform scar Normal   Vessels NPDR- Moderate NPDR- Moderate   Periphery Normal old scar, old scar inferiorly Normal            IMAGING AND PROCEDURES  Imaging and Procedures for 10/19/20  OCT, Retina - OU - Both Eyes       Right Eye Quality was good. Scan locations included subfoveal. Central Foveal Thickness: 299. Progression has been stable. Findings include no IRF, abnormal foveal contour, no SRF, retinal drusen .   Left Eye Quality was good. Scan locations included subfoveal. Central Foveal Thickness: 345. Findings include abnormal foveal contour, retinal drusen , no SRF, no IRF.              ASSESSMENT/PLAN:  Moderate nonproliferative diabetic retinopathy of left eye with macular edema associated with type 2 diabetes mellitus ([place]) Patient reports not so good blood sugar control.  Blood sugar control is been encouraged to prevent and slow progression of diabetic retinopathy  Stable OU  Early stage nonexudative age-related macular degeneration of both eyes OS OD, no signs of CNVM  Nuclear sclerotic cataract of right eye Moderate cataract, progressive accounts for acuity follow-up with Dr. Rutherford Guys, likely would benefit from surgery      ICD-10-CM   1. Moderate nonproliferative diabetic  retinopathy of left eye with macular edema associated with type 2 diabetes mellitus (HCC)  [id number] OCT, Retina - OU - Both Eyes    2. Early stage nonexudative age-related macular degeneration of both eyes  H35.3131     3. Nuclear sclerotic cataract of right eye  H25.11       1.  OS stable over time  2.  OD with nuclear sclerotic cataract which I believe would benefit from cataract surgery  3.  Moderate nonproliferative diabetic retinopathy OU stable no advancement or progression  Ophthalmic Meds Ordered this visit:  No orders of the defined types were placed in this encounter.      Return in about 9 months (around 07/19/2021) for DILATE OU, COLOR FP, OCT.  There are no Patient Instructions on file for this visit.   Explained the diagnoses, plan, and follow up with the patient and they expressed understanding.  Patient expressed understanding of the importance of proper follow up care.   Clent Demark Kymani Laursen M.D. Diseases & Surgery of the Retina and Vitreous [facility] [place] 10/19/20     Abbreviations: M myopia (nearsighted); A astigmatism; H hyperopia (farsighted); P presbyopia; Mrx spectacle prescription;  CTL contact lenses; OD right eye; OS left eye; OU both eyes  XT exotropia; ET esotropia; PEK punctate epithelial keratitis; PEE punctate epithelial erosions; DES dry eye syndrome; MGD meibomian gland dysfunction; ATs artificial tears; PFAT's preservative free artificial tears; [place] nuclear sclerotic cataract; PSC posterior subcapsular cataract; ERM epi-retinal membrane; PVD posterior vitreous detachment; RD retinal detachment; DM diabetes mellitus; DR diabetic retinopathy; NPDR non-proliferative diabetic retinopathy; PDR proliferative diabetic retinopathy; CSME clinically significant macular edema; DME diabetic macular edema; dbh dot blot hemorrhages; CWS cotton wool spot; POAG primary open angle glaucoma; C/D  cup-to-disc ratio; HVF humphrey visual field; GVF goldmann visual field; OCT optical coherence tomography; IOP intraocular pressure; BRVO Branch retinal vein occlusion; CRVO central retinal vein occlusion; CRAO central retinal artery occlusion; BRAO branch retinal artery occlusion; RT retinal tear; SB scleral buckle; PPV pars plana vitrectomy; VH Vitreous hemorrhage; PRP panretinal laser photocoagulation; IVK intravitreal kenalog; VMT vitreomacular traction; MH Macular hole;  NVD neovascularization of the disc; NVE neovascularization elsewhere; AREDS age related eye disease study; ARMD age related macular degeneration; POAG primary open angle glaucoma; EBMD epithelial/anterior basement membrane dystrophy; ACIOL anterior chamber intraocular lens; IOL intraocular lens; PCIOL posterior chamber intraocular lens; Phaco/IOL phacoemulsification with intraocular lens placement; [place] photorefractive keratectomy; LASIK laser assisted in situ keratomileusis; HTN hypertension; DM diabetes mellitus; COPD chronic obstructive pulmonary disease

## 2020-12-08 DIAGNOSIS — N644 Mastodynia: Secondary | ICD-10-CM | POA: Diagnosis not present

## 2020-12-20 DIAGNOSIS — M25562 Pain in left knee: Secondary | ICD-10-CM | POA: Diagnosis not present

## 2020-12-20 DIAGNOSIS — E1142 Type 2 diabetes mellitus with diabetic polyneuropathy: Secondary | ICD-10-CM | POA: Diagnosis not present

## 2020-12-20 DIAGNOSIS — E875 Hyperkalemia: Secondary | ICD-10-CM | POA: Diagnosis not present

## 2020-12-20 DIAGNOSIS — I10 Essential (primary) hypertension: Secondary | ICD-10-CM | POA: Diagnosis not present

## 2020-12-23 ENCOUNTER — Ambulatory Visit: Payer: Medicare HMO | Admitting: Podiatry

## 2020-12-30 DIAGNOSIS — M25562 Pain in left knee: Secondary | ICD-10-CM | POA: Diagnosis not present

## 2021-01-24 DIAGNOSIS — I10 Essential (primary) hypertension: Secondary | ICD-10-CM | POA: Diagnosis not present

## 2021-01-24 DIAGNOSIS — E875 Hyperkalemia: Secondary | ICD-10-CM | POA: Diagnosis not present

## 2021-01-24 DIAGNOSIS — M5430 Sciatica, unspecified side: Secondary | ICD-10-CM | POA: Diagnosis not present

## 2021-02-01 ENCOUNTER — Ambulatory Visit
Admission: RE | Admit: 2021-02-01 | Discharge: 2021-02-01 | Disposition: A | Discharge status: Home / Self Care | Payer: Medicare HMO | Source: Ambulatory Visit | Attending: Family Medicine | Admitting: Family Medicine

## 2021-02-01 ENCOUNTER — Other Ambulatory Visit: Payer: Self-pay | Admitting: Sports Medicine

## 2021-02-01 ENCOUNTER — Other Ambulatory Visit: Payer: Self-pay | Admitting: Family Medicine

## 2021-02-01 ENCOUNTER — Ambulatory Visit
Admission: RE | Admit: 2021-02-01 | Discharge: 2021-02-01 | Disposition: A | Discharge status: Home / Self Care | Payer: Medicare HMO | Source: Ambulatory Visit | Attending: Sports Medicine | Admitting: Sports Medicine

## 2021-02-01 DIAGNOSIS — M533 Sacrococcygeal disorders, not elsewhere classified: Secondary | ICD-10-CM | POA: Diagnosis not present

## 2021-02-01 DIAGNOSIS — M5430 Sciatica, unspecified side: Secondary | ICD-10-CM

## 2021-02-01 DIAGNOSIS — M25562 Pain in left knee: Secondary | ICD-10-CM

## 2021-02-01 DIAGNOSIS — I700 Atherosclerosis of aorta: Secondary | ICD-10-CM | POA: Diagnosis not present

## 2021-02-01 DIAGNOSIS — M85862 Other specified disorders of bone density and structure, left lower leg: Secondary | ICD-10-CM | POA: Diagnosis not present

## 2021-02-01 DIAGNOSIS — M47817 Spondylosis without myelopathy or radiculopathy, lumbosacral region: Secondary | ICD-10-CM | POA: Diagnosis not present

## 2021-02-01 DIAGNOSIS — M1712 Unilateral primary osteoarthritis, left knee: Secondary | ICD-10-CM | POA: Diagnosis not present

## 2021-02-01 DIAGNOSIS — M47816 Spondylosis without myelopathy or radiculopathy, lumbar region: Secondary | ICD-10-CM | POA: Diagnosis not present

## 2021-02-08 DIAGNOSIS — S76311A Strain of muscle, fascia and tendon of the posterior muscle group at thigh level, right thigh, initial encounter: Secondary | ICD-10-CM | POA: Diagnosis not present

## 2021-02-08 DIAGNOSIS — M7918 Myalgia, other site: Secondary | ICD-10-CM | POA: Diagnosis not present

## 2021-02-09 ENCOUNTER — Ambulatory Visit: Payer: Self-pay

## 2021-02-09 ENCOUNTER — Ambulatory Visit: Payer: Medicare HMO | Admitting: Family Medicine

## 2021-02-09 ENCOUNTER — Encounter: Payer: Self-pay | Admitting: Family Medicine

## 2021-02-09 VITALS — BP 150/80 | Ht 66.0 in | Wt 154.0 lb

## 2021-02-09 DIAGNOSIS — M76899 Other specified enthesopathies of unspecified lower limb, excluding foot: Secondary | ICD-10-CM | POA: Insufficient documentation

## 2021-02-09 DIAGNOSIS — M25551 Pain in right hip: Secondary | ICD-10-CM | POA: Insufficient documentation

## 2021-02-09 DIAGNOSIS — M25571 Pain in right ankle and joints of right foot: Secondary | ICD-10-CM

## 2021-02-09 DIAGNOSIS — M5416 Radiculopathy, lumbar region: Secondary | ICD-10-CM | POA: Diagnosis not present

## 2021-02-09 DIAGNOSIS — M533 Sacrococcygeal disorders, not elsewhere classified: Secondary | ICD-10-CM

## 2021-02-09 MED ORDER — MELOXICAM 7.5 MG PO TABS: 7.5000 mg | ORAL_TABLET | Freq: Two times a day (BID) | ORAL | 1 refills | Status: DC | PRN

## 2021-02-09 NOTE — Progress Notes (Signed)
Anna Norris - 80 y.o. female MRN [id number]  Date of birth: 22-Mar-1940  SUBJECTIVE:  Including CC & ROS.  No chief complaint on file.   Anna Norris is a 80 y.o. female that is presenting with right ischial pain as well as lateral hip pain and lower right leg pain.  Pain has gotten worse over the past 3 weeks.  Denies any specific injury or inciting event..  Independent review of the lumbar spine x-ray from 11/15 shows degenerative disc changes at L4-5 and L5-S1 with facet hypertrophy.   Review of Systems See HPI   HISTORY: Past Medical, Surgical, Social, and Family History Reviewed & Updated per EMR.   Pertinent Historical Findings include:  Past Medical History:  Diagnosis Date   Asthma    Complication of anesthesia    can not have novacain   Cystoid macular edema of left eye 12/22/2019   Diabetes mellitus    A1C < 7   Headache    Tachycardia    no problems now.   Vision abnormalities    hole in left eye    Past Surgical History:  Procedure Laterality Date   BREAST LUMPECTOMY     FOOT SURGERY     HAND SURGERY     TUBAL LIGATION     uterine cyst      Family History  Problem Relation Age of Onset   Heart failure Father    Lymphoma Brother    Stroke Neg Hx     Social History   Socioeconomic History   Marital status: Married    Spouse name: Not on file   Number of children: Not on file   Years of education: Not on file   Highest education level: Not on file  Occupational History   Not on file  Tobacco Use   Smoking status: Never   Smokeless tobacco: Never  Substance and Sexual Activity   Alcohol use: Yes    Comment: once a year   Drug use: No   Sexual activity: Not on file  Other Topics Concern   Not on file  Social History Narrative   Not on file   Social Determinants of Health   Financial Resource Strain: Not on file  Food Insecurity: Not on file  Transportation Needs: Not on file  Physical Activity: Not on file  Stress: Not on file   Social Connections: Not on file  Intimate Partner Violence: Not on file     PHYSICAL EXAM:  VS: BP (!) 150/80 (BP Location: Left Arm, Patient Position: Sitting)   Ht 5\' 6"  (1.676 m)   Wt 154 lb (69.9 kg)   BMI 24.86 kg/m  Physical Exam Gen: NAD, alert, cooperative with exam, well-appearing   Limited ultrasound: Right lower leg, right hamstring:  Right ankle: No changes of the ankle joint. Normal distal tibia and fibula. No appreciated changes in the peroneal tendons or in the lateral compartment.  Right hamstring There is increased hyperemia at the origin of the hamstring. Irregular origin at the ischial tuberosity to demonstrate chronic change  Summary: Hamstring tendinitis at origin  Ultrasound and interpretation by Clearance Coots, MD    ASSESSMENT & PLAN:   Lumbar radiculopathy Having pain in the lateral compartment of the lower leg with no identified changes on ultrasound today.  Likely more of a radicular type pain -Counseled on home exercise therapy and supportive care. -Meloxicam. -Could consider an SI joint injection  Sacroiliac joint dysfunction of right side Has tenderness to  palpation of the right SI joint which could be contributing some of her radicular type pain.  -Counseled on home exercise therapy and supportive care. -Could consider injection or physical therapy.  Hamstring tendinitis at origin There appears to be increased hyperemia at the origin.   -Counseled on home exercise therapy and supportive care. -Could consider nitro patches or physical therapy.  Greater trochanteric pain syndrome of right lower extremity Tender to palpation over the lateral aspect is more greater trochanteric in nature. -Counseled on home exercise therapy and supportive care. -Could consider injection physical therapy.

## 2021-02-09 NOTE — Assessment & Plan Note (Signed)
Tender to palpation over the lateral aspect is more greater trochanteric in nature. -Counseled on home exercise therapy and supportive care. -Could consider injection physical therapy.

## 2021-02-09 NOTE — Assessment & Plan Note (Signed)
Having pain in the lateral compartment of the lower leg with no identified changes on ultrasound today.  Likely more of a radicular type pain -Counseled on home exercise therapy and supportive care. -Meloxicam. -Could consider an SI joint injection

## 2021-02-09 NOTE — Patient Instructions (Signed)
Nice to meet you Please try heat  Please use the mobic for 5 days straight and then as needed  Please try the exercises   Please send me a message in [place] with any questions or updates.  Please see me back in 1 week.   --Dr. Raeford Razor

## 2021-02-09 NOTE — Assessment & Plan Note (Signed)
Has tenderness to palpation of the right SI joint which could be contributing some of her radicular type pain.  -Counseled on home exercise therapy and supportive care. -Could consider injection or physical therapy.

## 2021-02-09 NOTE — Assessment & Plan Note (Signed)
There appears to be increased hyperemia at the origin.   -Counseled on home exercise therapy and supportive care. -Could consider nitro patches or physical therapy.

## 2021-02-15 DIAGNOSIS — L821 Other seborrheic keratosis: Secondary | ICD-10-CM | POA: Diagnosis not present

## 2021-02-15 DIAGNOSIS — L570 Actinic keratosis: Secondary | ICD-10-CM | POA: Diagnosis not present

## 2021-02-16 ENCOUNTER — Encounter: Payer: Self-pay | Admitting: Family Medicine

## 2021-02-16 ENCOUNTER — Ambulatory Visit: Payer: Medicare HMO | Admitting: Family Medicine

## 2021-02-16 ENCOUNTER — Other Ambulatory Visit: Payer: Self-pay

## 2021-02-16 ENCOUNTER — Ambulatory Visit (HOSPITAL_BASED_OUTPATIENT_CLINIC_OR_DEPARTMENT_OTHER)
Admission: RE | Admit: 2021-02-16 | Discharge: 2021-02-16 | Disposition: A | Discharge status: Home / Self Care | Payer: Medicare HMO | Source: Ambulatory Visit | Attending: Family Medicine | Admitting: Family Medicine

## 2021-02-16 ENCOUNTER — Other Ambulatory Visit (HOSPITAL_BASED_OUTPATIENT_CLINIC_OR_DEPARTMENT_OTHER): Payer: Self-pay

## 2021-02-16 ENCOUNTER — Ambulatory Visit: Payer: Self-pay

## 2021-02-16 VITALS — BP 150/80 | Ht 66.0 in | Wt 154.0 lb

## 2021-02-16 DIAGNOSIS — M533 Sacrococcygeal disorders, not elsewhere classified: Secondary | ICD-10-CM

## 2021-02-16 DIAGNOSIS — M25551 Pain in right hip: Secondary | ICD-10-CM | POA: Diagnosis not present

## 2021-02-16 DIAGNOSIS — M5416 Radiculopathy, lumbar region: Secondary | ICD-10-CM | POA: Diagnosis not present

## 2021-02-16 MED ORDER — HYDROCODONE-ACETAMINOPHEN 5-325 MG PO TABS
1.0000 | ORAL_TABLET | Freq: Three times a day (TID) | ORAL | 0 refills | Status: DC | PRN
  Filled 2021-02-16: qty 15, 5d supply, fill #0

## 2021-02-16 NOTE — Assessment & Plan Note (Signed)
Continues to have significant pain down the right leg. -Counseled on home exercise therapy and supportive care. -Norco. -Referral to physical therapy. -Could consider gabapentin.

## 2021-02-16 NOTE — Patient Instructions (Signed)
Good to see you Please continue heat  I will call with the xray results.  You can start physical therapy when you want  Please use the norco as needed and for severe pain   Please send me a message in MyChart with any questions or updates.  Please see me back in 2-3 weeks.   --Dr. Raeford Razor

## 2021-02-16 NOTE — Progress Notes (Signed)
Anna Norris - 80 y.o. female MRN [id number]  Date of birth: May 09, 1940  SUBJECTIVE:  Including CC & ROS.  No chief complaint on file.   Anna Norris is a 80 y.o. female that is presenting with worsening of her right low back and leg pain.  The pain is severe in nature.  She is continue to use the Norco and meloxicam.   Review of Systems See HPI   HISTORY: Past Medical, Surgical, Social, and Family History Reviewed & Updated per EMR.   Pertinent Historical Findings include:  Past Medical History:  Diagnosis Date   Asthma    Complication of anesthesia    can not have novacain   Cystoid macular edema of left eye 12/22/2019   Diabetes mellitus    A1C < 7   Headache    Tachycardia    no problems now.   Vision abnormalities    hole in left eye    Past Surgical History:  Procedure Laterality Date   BREAST LUMPECTOMY     FOOT SURGERY     HAND SURGERY     TUBAL LIGATION     uterine cyst      Family History  Problem Relation Age of Onset   Heart failure Father    Lymphoma Brother    Stroke Neg Hx     Social History   Socioeconomic History   Marital status: Married    Spouse name: Not on file   Number of children: Not on file   Years of education: Not on file   Highest education level: Not on file  Occupational History   Not on file  Tobacco Use   Smoking status: Never   Smokeless tobacco: Never  Substance and Sexual Activity   Alcohol use: Yes    Comment: once a year   Drug use: No   Sexual activity: Not on file  Other Topics Concern   Not on file  Social History Narrative   Not on file   Social Determinants of Health   Financial Resource Strain: Not on file  Food Insecurity: Not on file  Transportation Needs: Not on file  Physical Activity: Not on file  Stress: Not on file  Social Connections: Not on file  Intimate Partner Violence: Not on file     PHYSICAL EXAM:  VS: BP (!) 150/80 (BP Location: Left Arm, Patient Position: Sitting)   Ht 5'  6" (1.676 m)   Wt 154 lb (69.9 kg)   BMI 24.86 kg/m  Physical Exam Gen: NAD, alert, cooperative with exam, well-appearing    Aspiration/Injection Procedure Note Anna Norris 1941/02/02  Procedure: Injection Indications: Right SI joint pain  Procedure Details Consent: Risks of procedure as well as the alternatives and risks of each were explained to the (patient/caregiver).  Consent for procedure obtained. Time Out: Verified patient identification, verified procedure, site/side was marked, verified correct patient position, special equipment/implants available, medications/allergies/relevent history reviewed, required imaging and test results available.  Performed.  The area was cleaned with iodine and alcohol swabs.    The right SI joint was injected using 3 cc's of 1% lidocaine and 5 cc's of 0.25% bupivacaine with a 22 3 1/2" needle.  Ultrasound was needed in order to visualize the placement of the needle with into the SI joint.  Ultrasound was used. Images were obtained in long views showing the injection.     A sterile dressing was applied.  Patient did tolerate procedure well.  ASSESSMENT & PLAN:   Lumbar radiculopathy Continues to have significant pain down the right leg. -Counseled on home exercise therapy and supportive care. -Norco. -Referral to physical therapy. -Could consider gabapentin.   Sacroiliac joint dysfunction of right side Pain does seem to be radiating from the SI joint or the lower lumbar facets. -Counseled on home exercise therapy and supportive care. -X-ray. -Diagnostic injection today.  Could consider steroid injection going forward.

## 2021-02-16 NOTE — Assessment & Plan Note (Signed)
Pain does seem to be radiating from the SI joint or the lower lumbar facets. -Counseled on home exercise therapy and supportive care. -X-ray. -Diagnostic injection today.  Could consider steroid injection going forward.

## 2021-02-18 ENCOUNTER — Telehealth: Payer: Self-pay | Admitting: Family Medicine

## 2021-02-18 ENCOUNTER — Encounter: Payer: Self-pay | Admitting: Family Medicine

## 2021-02-18 DIAGNOSIS — M533 Sacrococcygeal disorders, not elsewhere classified: Secondary | ICD-10-CM | POA: Diagnosis not present

## 2021-02-18 NOTE — Telephone Encounter (Signed)
SI joint brace rx faxed to [facility] @ [phone]. I spoke to [place] at the [facility]) and she advised me to have patient come on today to be fitted for her brace. Pt informed and address and phone # provided to patient.

## 2021-02-18 NOTE — Telephone Encounter (Signed)
Patient called wonders if X-ray results in yet---forwarding message to med asst for review & to contact pt w/ status  @ [phone].  --Dion Body

## 2021-02-18 NOTE — Telephone Encounter (Signed)
See 02/18/21 tele encounter.

## 2021-02-18 NOTE — Telephone Encounter (Signed)
Informed of results. Try SI brace. Did get improvement with SI joint injection. Does get improvement with gabapentin.   Rosemarie Ax, MD [facility] 02/18/2021, 12:31 PM

## 2021-02-21 ENCOUNTER — Other Ambulatory Visit (HOSPITAL_BASED_OUTPATIENT_CLINIC_OR_DEPARTMENT_OTHER): Payer: Self-pay

## 2021-02-21 ENCOUNTER — Telehealth: Payer: Self-pay | Admitting: Family Medicine

## 2021-02-21 MED ORDER — PREDNISONE 5 MG PO TABS: ORAL_TABLET | ORAL | 0 refills | Status: DC

## 2021-02-21 MED ORDER — PREDNISONE 5 MG PO TABS
ORAL_TABLET | ORAL | 0 refills | Status: DC
  Filled 2021-02-21: qty 21, 6d supply, fill #0

## 2021-02-21 NOTE — Telephone Encounter (Signed)
Patient having ongoing pain. Have tried brace and other medications. Did get improvement with SI joint injection but only had anesthetic. Provide prednisone and may need MRI   Rosemarie Ax, MD [facility] 02/21/2021, 4:45 PM

## 2021-02-21 NOTE — Telephone Encounter (Signed)
Patient called to request MRI, says the pain is no better even with brace wearing and especially when the pain meds wearout .  --Patient says provider can call her if any questions .  --glh

## 2021-02-28 ENCOUNTER — Telehealth: Payer: Self-pay | Admitting: *Deleted

## 2021-02-28 DIAGNOSIS — M5416 Radiculopathy, lumbar region: Secondary | ICD-10-CM

## 2021-02-28 MED ORDER — HYDROCODONE-ACETAMINOPHEN 5-325 MG PO TABS: 1.0000 | ORAL_TABLET | Freq: Three times a day (TID) | ORAL | 0 refills | Status: DC | PRN

## 2021-02-28 NOTE — Telephone Encounter (Signed)
Patient called- she c/o no improvement in pain. She is going to refill her meloxicam.  She is requesting MRI and refill on hydroco/apap. Please advise.

## 2021-02-28 NOTE — Telephone Encounter (Signed)
Patient having worsening low back pain as well as radicular pain with weakness in the right leg.  Concern for compression fracture given severity of pain and no improvement with modalities today.  Having neurovascular compromise given neurological changes.  We will pursue MRI of the lumbar spine for evaluation and further management.  For consideration of medication use or epidural.  Rosemarie Ax, MD [facility] 02/28/2021, 4:14 PM

## 2021-03-01 ENCOUNTER — Telehealth: Payer: Self-pay | Admitting: Family Medicine

## 2021-03-01 ENCOUNTER — Other Ambulatory Visit: Payer: Self-pay | Admitting: Family Medicine

## 2021-03-01 ENCOUNTER — Other Ambulatory Visit (HOSPITAL_BASED_OUTPATIENT_CLINIC_OR_DEPARTMENT_OTHER): Payer: Self-pay

## 2021-03-01 MED ORDER — HYDROCODONE-ACETAMINOPHEN 5-325 MG PO TABS
1.0000 | ORAL_TABLET | Freq: Three times a day (TID) | ORAL | 0 refills | Status: DC | PRN
  Filled 2021-03-01 (×2): qty 15, 5d supply, fill #0

## 2021-03-01 MED ORDER — DIAZEPAM 5 MG PO TABS
ORAL_TABLET | ORAL | 0 refills | Status: DC
  Filled 2021-03-01: qty 2, 2d supply, fill #0

## 2021-03-01 NOTE — Progress Notes (Signed)
Provided valium for MRI.   Rosemarie Ax, MD [facility] 03/01/2021, 4:52 PM

## 2021-03-04 ENCOUNTER — Ambulatory Visit: Payer: Medicare HMO | Admitting: Family Medicine

## 2021-03-04 ENCOUNTER — Encounter: Payer: Self-pay | Admitting: Family Medicine

## 2021-03-04 DIAGNOSIS — M5416 Radiculopathy, lumbar region: Secondary | ICD-10-CM | POA: Diagnosis not present

## 2021-03-04 NOTE — Assessment & Plan Note (Signed)
Acute on chronic in nature.  Pain is still ongoing and having radicular type pain down the right leg. -Counseled on home exercise therapy and supportive care. -Awaiting MRI results.

## 2021-03-04 NOTE — Progress Notes (Signed)
°  Anna Norris - 80 y.o. female MRN [id number]  Date of birth: 1940/06/02  SUBJECTIVE:  Including CC & ROS.  No chief complaint on file.   Anna Norris is a 80 y.o. female that is presenting with ongoing low back and right leg radicular pain.  She does get some improvement with the medications but the pain is still severe in nature.  Continues to wear the SI belt.   Review of Systems See HPI   HISTORY: Past Medical, Surgical, Social, and Family History Reviewed & Updated per EMR.   Pertinent Historical Findings include:  Past Medical History:  Diagnosis Date   Asthma    Complication of anesthesia    can not have novacain   Cystoid macular edema of left eye 12/22/2019   Diabetes mellitus    A1C < 7   Headache    Tachycardia    no problems now.   Vision abnormalities    hole in left eye    Past Surgical History:  Procedure Laterality Date   BREAST LUMPECTOMY     FOOT SURGERY     HAND SURGERY     TUBAL LIGATION     uterine cyst      Family History  Problem Relation Age of Onset   Heart failure Father    Lymphoma Brother    Stroke Neg Hx     Social History   Socioeconomic History   Marital status: Married    Spouse name: Not on file   Number of children: Not on file   Years of education: Not on file   Highest education level: Not on file  Occupational History   Not on file  Tobacco Use   Smoking status: Never   Smokeless tobacco: Never  Substance and Sexual Activity   Alcohol use: Yes    Comment: once a year   Drug use: No   Sexual activity: Not on file  Other Topics Concern   Not on file  Social History Narrative   Not on file   Social Determinants of Health   Financial Resource Strain: Not on file  Food Insecurity: Not on file  Transportation Needs: Not on file  Physical Activity: Not on file  Stress: Not on file  Social Connections: Not on file  Intimate Partner Violence: Not on file     PHYSICAL EXAM:  VS: BP 130/70 (BP Location: Left  Arm, Patient Position: Sitting)    Ht 5\' 6"  (1.676 m)    Wt 154 lb (69.9 kg)    BMI 24.86 kg/m  Physical Exam Gen: NAD, alert, cooperative with exam, well-appearing   ASSESSMENT & PLAN:   Lumbar radiculopathy Acute on chronic in nature.  Pain is still ongoing and having radicular type pain down the right leg. -Counseled on home exercise therapy and supportive care. -Awaiting MRI results.

## 2021-03-06 ENCOUNTER — Other Ambulatory Visit: Payer: Self-pay

## 2021-03-06 ENCOUNTER — Ambulatory Visit (INDEPENDENT_AMBULATORY_CARE_PROVIDER_SITE_OTHER): Payer: Medicare HMO

## 2021-03-06 DIAGNOSIS — M5416 Radiculopathy, lumbar region: Secondary | ICD-10-CM

## 2021-03-06 DIAGNOSIS — M5127 Other intervertebral disc displacement, lumbosacral region: Secondary | ICD-10-CM | POA: Diagnosis not present

## 2021-03-08 ENCOUNTER — Telehealth (INDEPENDENT_AMBULATORY_CARE_PROVIDER_SITE_OTHER): Payer: Medicare HMO | Admitting: Family Medicine

## 2021-03-08 ENCOUNTER — Encounter: Payer: Self-pay | Admitting: Family Medicine

## 2021-03-08 VITALS — Ht 66.0 in | Wt 154.0 lb

## 2021-03-08 DIAGNOSIS — M5416 Radiculopathy, lumbar region: Secondary | ICD-10-CM

## 2021-03-08 NOTE — Assessment & Plan Note (Signed)
Symptoms seem most consistent with the degenerative disc changes at L4 with the suggestion of a nerve impingement at this level. -Counseled on home exercise therapy and supportive care. -Pursue epidural.

## 2021-03-08 NOTE — Progress Notes (Signed)
Virtual Visit via Video Note  I connected with Anna Norris on 03/08/21 at  3:50 PM EST by a video enabled telemedicine application and verified that I am speaking with the correct person using two identifiers.  Location: Patient: Home Provider: Office   I discussed the limitations of evaluation and management by telemedicine and the availability of in person appointments. The patient expressed understanding and agreed to proceed.  History of Present Illness:  [name] is an 80 year old female that is following up after the MRI of the spine.  This was showing possible nerve impingement on the right side at L4.  Observations/Objective:   Assessment and Plan:  Lumbar radiculopathy: Symptoms seem most consistent with the degenerative disc changes at L4 with the suggestion of a nerve impingement at this level. -Counseled on home exercise therapy and supportive care. -Pursue epidural.  Follow Up Instructions:    I discussed the assessment and treatment plan with the patient. The patient was provided an opportunity to ask questions and all were answered. The patient agreed with the plan and demonstrated an understanding of the instructions.   The patient was advised to call back or seek an in-person evaluation if the symptoms worsen or if the condition fails to improve as anticipated.     Clearance Coots, MD

## 2021-03-11 ENCOUNTER — Other Ambulatory Visit (HOSPITAL_BASED_OUTPATIENT_CLINIC_OR_DEPARTMENT_OTHER): Payer: Self-pay

## 2021-03-11 ENCOUNTER — Other Ambulatory Visit: Payer: Self-pay | Admitting: Family Medicine

## 2021-03-11 MED ORDER — HYDROCODONE-ACETAMINOPHEN 5-325 MG PO TABS
1.0000 | ORAL_TABLET | Freq: Three times a day (TID) | ORAL | 0 refills | Status: DC | PRN
  Filled 2021-03-11: qty 15, 5d supply, fill #0

## 2021-03-16 ENCOUNTER — Ambulatory Visit
Admission: RE | Admit: 2021-03-16 | Discharge: 2021-03-16 | Disposition: A | Discharge status: Home / Self Care | Payer: Medicare HMO | Source: Ambulatory Visit | Attending: Family Medicine | Admitting: Family Medicine

## 2021-03-16 DIAGNOSIS — M5416 Radiculopathy, lumbar region: Secondary | ICD-10-CM

## 2021-03-16 DIAGNOSIS — M5116 Intervertebral disc disorders with radiculopathy, lumbar region: Secondary | ICD-10-CM | POA: Diagnosis not present

## 2021-03-16 DIAGNOSIS — M47817 Spondylosis without myelopathy or radiculopathy, lumbosacral region: Secondary | ICD-10-CM | POA: Diagnosis not present

## 2021-03-16 MED ORDER — IOPAMIDOL (ISOVUE-M 200) INJECTION 41%
1.0000 mL | Freq: Once | INTRAMUSCULAR | Status: AC
  Administered 2021-03-16: 13:00:00 1 mL via EPIDURAL

## 2021-03-16 MED ORDER — METHYLPREDNISOLONE ACETATE 40 MG/ML INJ SUSP (RADIOLOG
80.0000 mg | Freq: Once | INTRAMUSCULAR | Status: AC
  Administered 2021-03-16: 13:00:00 80 mg via EPIDURAL

## 2021-03-16 NOTE — Discharge Instructions (Signed)

## 2021-03-22 NOTE — Telephone Encounter (Signed)
Patient called and left a vm stating ESI has not helped. She wants to know what she can do next. Please advise.

## 2021-03-23 NOTE — Telephone Encounter (Signed)
Spoke with patient about her pain.  She did get relief with the anesthesia from the epidural but the pain returned and is ongoing and severe.  We will try referral to Dr. Ernestina Patches for possible repeat epidural and facet injections.  Rosemarie Ax, MD [facility] 03/23/2021, 9:15 AM

## 2021-03-23 NOTE — Addendum Note (Signed)
Addended by: Rosemarie Ax on: 03/23/2021 09:15 AM   Modules accepted: Orders

## 2021-03-25 ENCOUNTER — Telehealth: Payer: Self-pay | Admitting: Family Medicine

## 2021-03-25 ENCOUNTER — Other Ambulatory Visit (HOSPITAL_BASED_OUTPATIENT_CLINIC_OR_DEPARTMENT_OTHER): Payer: Self-pay

## 2021-03-25 MED ORDER — MELOXICAM 7.5 MG PO TABS
7.5000 mg | ORAL_TABLET | Freq: Two times a day (BID) | ORAL | 1 refills | Status: DC | PRN
  Filled 2021-03-25: qty 45, 23d supply, fill #0
  Filled 2021-05-25: qty 45, 23d supply, fill #1

## 2021-03-25 NOTE — Telephone Encounter (Signed)
--  Pt cld requested refill    meloxicam (MOBIC) 7.5 MG tablet [[id number]]    Order Details Dose: 7.5 mg Route: Oral Frequency: 2 times daily PRN  Dispense Quantity: 45 tablet Refills: 1        Sig: Take 1 tablet (7.5 mg total) by mouth 2 (two) times daily as needed.         --Per patient use New Pharmacy:  [facility] :   [facility] Phone:  [phone]  Fax:  [phone]      --glh

## 2021-03-25 NOTE — Telephone Encounter (Signed)
Refill sent. See meds.  

## 2021-03-30 ENCOUNTER — Other Ambulatory Visit (HOSPITAL_BASED_OUTPATIENT_CLINIC_OR_DEPARTMENT_OTHER): Payer: Self-pay

## 2021-03-30 ENCOUNTER — Ambulatory Visit: Payer: Medicare HMO | Admitting: Neurology

## 2021-03-30 ENCOUNTER — Telehealth: Payer: Self-pay | Admitting: Neurology

## 2021-03-30 ENCOUNTER — Encounter: Payer: Self-pay | Admitting: Neurology

## 2021-03-30 VITALS — BP 168/84 | HR 86 | Ht 66.0 in | Wt 152.4 lb

## 2021-03-30 DIAGNOSIS — M533 Sacrococcygeal disorders, not elsewhere classified: Secondary | ICD-10-CM

## 2021-03-30 DIAGNOSIS — R112 Nausea with vomiting, unspecified: Secondary | ICD-10-CM | POA: Insufficient documentation

## 2021-03-30 DIAGNOSIS — M5431 Sciatica, right side: Secondary | ICD-10-CM | POA: Diagnosis not present

## 2021-03-30 DIAGNOSIS — M5416 Radiculopathy, lumbar region: Secondary | ICD-10-CM

## 2021-03-30 DIAGNOSIS — M5430 Sciatica, unspecified side: Secondary | ICD-10-CM | POA: Insufficient documentation

## 2021-03-30 DIAGNOSIS — F419 Anxiety disorder, unspecified: Secondary | ICD-10-CM | POA: Insufficient documentation

## 2021-03-30 DIAGNOSIS — H9190 Unspecified hearing loss, unspecified ear: Secondary | ICD-10-CM | POA: Insufficient documentation

## 2021-03-30 DIAGNOSIS — R011 Cardiac murmur, unspecified: Secondary | ICD-10-CM | POA: Insufficient documentation

## 2021-03-30 DIAGNOSIS — I10 Essential (primary) hypertension: Secondary | ICD-10-CM | POA: Insufficient documentation

## 2021-03-30 DIAGNOSIS — E1142 Type 2 diabetes mellitus with diabetic polyneuropathy: Secondary | ICD-10-CM | POA: Insufficient documentation

## 2021-03-30 DIAGNOSIS — M8580 Other specified disorders of bone density and structure, unspecified site: Secondary | ICD-10-CM | POA: Insufficient documentation

## 2021-03-30 MED ORDER — TOPIRAMATE 25 MG PO TABS
25.0000 mg | ORAL_TABLET | Freq: Two times a day (BID) | ORAL | 3 refills | Status: DC
  Filled 2021-03-30: qty 120, 60d supply, fill #0

## 2021-03-30 MED ORDER — TOPIRAMATE 25 MG PO TABS: 25.0000 mg | ORAL_TABLET | Freq: Two times a day (BID) | ORAL | 3 refills | Status: DC

## 2021-03-30 NOTE — Patient Instructions (Signed)
I had a long discussion the patient and her daughter regarding this severe sciatica which appears refractory to current medication regimen.  Recommend she add Topamax 25 mg twice daily and continue gabapentin 400 mg 3 times daily.  Check EMG nerve conduction study as well as keep scheduled appointment with orthopedic surgeon to discuss surgical treatment options.  She will return follow-up in 3 to 4 months or call earlier if necessary. Sciatica Sciatica is pain, numbness, weakness, or tingling along the path of the sciatic nerve. The sciatic nerve starts in the lower back and runs down the back of each leg. The nerve controls the muscles in the lower leg and in the back of the knee. It also provides feeling (sensation) to the back of the thigh, the lower leg, and the sole of the foot. Sciatica is a symptom of another medical condition that pinches or puts pressure on the sciatic nerve. Sciatica most often only affects one side of the body. Sciatica usually goes away on its own or with treatment. In some cases, sciatica may come back (recur). What are the causes? This condition is caused by pressure on the sciatic nerve or pinching of the nerve. This may be the result of: A disk in between the bones of the spine bulging out too far (herniated disk). Age-related changes in the spinal disks. A pain disorder that affects a muscle in the buttock. Extra bone growth near the sciatic nerve. A break (fracture) of the pelvis. Pregnancy. Tumor. This is rare. What increases the risk? The following factors may make you more likely to develop this condition: Playing sports that place pressure or stress on the spine. Having poor strength and flexibility. A history of back injury or surgery. Sitting for long periods of time. Doing activities that involve repetitive bending or lifting. Obesity. What are the signs or symptoms? Symptoms can vary from mild to very severe, and they may include: Any of these  problems in the lower back, leg, hip, or buttock: Mild tingling, numbness, or dull aches. Burning sensations. Sharp pains. Numbness in the back of the calf or the sole of the foot. Leg weakness. Severe back pain that makes movement difficult. Symptoms may get worse when you cough, sneeze, or laugh, or when you sit or stand for long periods of time. How is this diagnosed? This condition may be diagnosed based on: Your symptoms and medical history. A physical exam. Blood tests. Imaging tests, such as: X-rays. MRI. CT scan. How is this treated? In many cases, this condition improves on its own without treatment. However, treatment may include: Reducing or modifying physical activity. Exercising and stretching. Icing and applying heat to the affected area. Medicines that help to: Relieve pain and swelling. Relax your muscles. Injections of medicines that help to relieve pain, irritation, and inflammation around the sciatic nerve (steroids). Surgery. Follow these instructions at home: Medicines Take over-the-counter and prescription medicines only as told by your health care provider. Ask your health care provider if the medicine prescribed to you: Requires you to avoid driving or using heavy machinery. Can cause constipation. You may need to take these actions to prevent or treat constipation: Drink enough fluid to keep your urine pale yellow. Take over-the-counter or prescription medicines. Eat foods that are high in fiber, such as beans, whole grains, and fresh fruits and vegetables. Limit foods that are high in fat and processed sugars, such as fried or sweet foods. Managing pain   If directed, put ice on the affected  area. Put ice in a plastic bag. Place a towel between your skin and the bag. Leave the ice on for 20 minutes, 2-3 times a day. If directed, apply heat to the affected area. Use the heat source that your health care provider recommends, such as a moist heat pack  or a heating pad. Place a towel between your skin and the heat source. Leave the heat on for 20-30 minutes. Remove the heat if your skin turns bright red. This is especially important if you are unable to feel pain, heat, or cold. You may have a greater risk of getting burned. Activity  Return to your normal activities as told by your health care provider. Ask your health care provider what activities are safe for you. Avoid activities that make your symptoms worse. Take brief periods of rest throughout the day. When you rest for longer periods, mix in some mild activity or stretching between periods of rest. This will help to prevent stiffness and pain. Avoid sitting for long periods of time without moving. Get up and move around at least one time each hour. Exercise and stretch regularly, as told by your health care provider. Do not lift anything that is heavier than 10 lb (4.5 kg) while you have symptoms of sciatica. When you do not have symptoms, you should still avoid heavy lifting, especially repetitive heavy lifting. When you lift objects, always use proper lifting technique, which includes: Bending your knees. Keeping the load close to your body. Avoiding twisting. General instructions Maintain a healthy weight. Excess weight puts extra stress on your back. Wear supportive, comfortable shoes. Avoid wearing high heels. Avoid sleeping on a mattress that is too soft or too hard. A mattress that is firm enough to support your back when you sleep may help to reduce your pain. Keep all follow-up visits as told by your health care provider. This is important. Contact a health care provider if: You have pain that: Wakes you up when you are sleeping. Gets worse when you lie down. Is worse than you have experienced in the past. Lasts longer than 4 weeks. You have an unexplained weight loss. Get help right away if: You are not able to control when you urinate or have bowel movements  (incontinence). You have: Weakness in your lower back, pelvis, buttocks, or legs that gets worse. Redness or swelling of your back. A burning sensation when you urinate. Summary Sciatica is pain, numbness, weakness, or tingling along the path of the sciatic nerve. This condition is caused by pressure on the sciatic nerve or pinching of the nerve. Sciatica can cause pain, numbness, or tingling in the lower back, legs, hips, and buttocks. Treatment often includes rest, exercise, medicines, and applying ice or heat. This information is not intended to replace advice given to you by your health care provider. Make sure you discuss any questions you have with your health care provider. Document Revised: 03/25/2018 Document Reviewed: 03/25/2018 Elsevier Patient Education  Anna Norris.

## 2021-03-30 NOTE — Telephone Encounter (Signed)
Refill has been sent.  °

## 2021-03-30 NOTE — Progress Notes (Signed)
[facility] [address] [place]. [place] [address] [phone]       OFFICE CONSULT NOTE  Ms. Curt Jews Date of Birth:  Dec 15, 1940 Medical Record Number:  [id number]   Referring MD: Melinda Crutch  Reason for Referral: Sciatica  HPI: Anna Norris is a 81 year old pleasant Caucasian lady seen today for initial consultation visit for right leg radicular pain.  She is accompanied by her daughter.  History is obtained from them and review of electronic medical records.  I reviewed pertinent available imaging films in PACS.  She has past medical history of diabetes, asthma, atypical migraine and who has seen me in the past and was last seen in the office in December 2019 for typical migraine.  Patient is referred back today for new complaint of right leg pain.  She states that this began suddenly on October 30 last year.  She describes the pain as being sharp shooting intermittent starting in the right low back and shooting down her but down the posterior aspect of the thigh and leg into her toes.  At times this pain is unbearable.  Times she has increased sensitivity to touch on the right leg particularly the lateral aspect.  She has been evaluated by primary care physician and referred to sports medicine Dr. Reginal Lutes and she describes a variety of tests including muscle ultrasound and MRI of her spine on 03/07/2021 and I personally reviewed the films which show chronic disc degeneration at L4-5 with right greater than left foraminal narrowing and possible encroachment on the L4 nerve root.  L5-S1 shows only minor degenerative change but no definite nerve root compression.  The patient's clinical symptomatology is more compatible with right S1 radiculopathy but there is no significant definite impingement noted on the MRI she was referred for epidural injection which she had once without any relief.  She has tried variety of nonsteroidal anti-inflammatory drugs like Mobic,  Tylenol without much relief.  Lidocaine spray seems to provide some temporary relief.  She is currently on gabapentin 400 mg 3 times daily which seems to have helped somewhat.  She feels her quality of life is significantly affected and walking is quite painful due to this.  She denies any prior history of back injury, fall, motor vehicle accident or known spinal stenosis or disc problems.  She has an appointment to see orthopedic surgeon to discuss possible surgical treatment options.  She denies any lifting heavy weights or slip or fall pulling or pushing something heavy prior to onset of the symptoms.  She has not yet had an EMG nerve conduction study done. Patient has had previous episodes of complicated migraine and 2014 2015 and 2018 and was seen by me at the last office visit being in December 2019.  She has had no further episodes of migraine or complicated migraine since then. ROS:   14 system review of systems is positive for back pain, leg pain, sciatica, hypersensitivity, numbness all other systems negative  PMH:  Past Medical History:  Diagnosis Date   Asthma    Complication of anesthesia    can not have novacain   Cystoid macular edema of left eye 12/22/2019   Diabetes mellitus    A1C < 7   Headache    Tachycardia    no problems now.   Vision abnormalities    hole in left eye    Social History:  Social History   Socioeconomic History   Marital status: Married    Spouse  name: Not on file   Number of children: Not on file   Years of education: Not on file   Highest education level: Not on file  Occupational History   Not on file  Tobacco Use   Smoking status: Never   Smokeless tobacco: Never  Substance and Sexual Activity   Alcohol use: Yes    Comment: once a year   Drug use: No   Sexual activity: Not on file  Other Topics Concern   Not on file  Social History Narrative   Not on file   Social Determinants of Health   Financial Resource Strain: Not on file   Food Insecurity: Not on file  Transportation Needs: Not on file  Physical Activity: Not on file  Stress: Not on file  Social Connections: Not on file  Intimate Partner Violence: Not on file    Medications:   Current Outpatient Medications on File Prior to Visit  Medication Sig Dispense Refill   albuterol (PROVENTIL HFA;VENTOLIN HFA) 108 (90 Base) MCG/ACT inhaler Inhale into the lungs.     clopidogrel (PLAVIX) 75 MG tablet Take 1 tablet (75 mg total) by mouth daily. 30 tablet 11   diazepam (VALIUM) 5 MG tablet Please take 30 minutes prior to procedure. May repeat x 1. 2 tablet 0   gabapentin (NEURONTIN) 300 MG capsule      glimepiride (AMARYL) 2 MG tablet Take 2 mg by mouth daily before breakfast.     lidocaine (LIDODERM) 5 %      lidocaine (LIDODERM) 5 % Place 1 patch onto the skin daily. Remove & Discard patch within 12 hours or as directed by MD 30 patch 0   meloxicam (MOBIC) 7.5 MG tablet Take 1 tablet (7.5 mg total) by mouth 2 (two) times daily as needed. 45 tablet 1   metFORMIN (GLUCOPHAGE) 500 MG tablet Take 1,000 mg by mouth 2 (two) times daily with a meal.     PROAIR HFA 108 (90 BASE) MCG/ACT inhaler Inhale 2 puffs into the lungs every 4 (four) hours as needed.      No current facility-administered medications on file prior to visit.    Allergies:   Allergies  Allergen Reactions   Naproxen Other (See Comments)   Novocain [Procaine]    Other Other (See Comments)    Mold - headaches    Peanut Oil     Other reaction(s): rash   Shellfish Allergy Swelling   Strawberry (Diagnostic) Other (See Comments)   Strawberry Extract Swelling    Strawberry with shrimp made lips swollen   Topiramate     Other reaction(s): belly pain   Yellow Dye Hives   Yellow Dyes (Non-Tartrazine) Hives    Physical Exam General: well developed, well nourished pleasant elderly Caucasian lady, seated, in no evident distress Head: head normocephalic and atraumatic.   Neck: supple with no carotid  or supraclavicular bruits Cardiovascular: regular rate and rhythm, no murmurs Musculoskeletal: no deformity mild spasm right paraspinal lumbosacral region.  Slight leg raising limited in the right leg due to sciatica Skin:  no rash/petichiae Vascular:  Normal pulses all extremities  Neurologic Exam Mental Status: Awake and fully alert. Oriented to place and time. Recent and remote memory intact. Attention span, concentration and fund of knowledge appropriate. Mood and affect appropriate.  Cranial Nerves: Fundoscopic exam reveals sharp disc margins. Pupils equal, briskly reactive to light. Extraocular movements full without nystagmus. Visual fields full to confrontation. Hearing intact. Facial sensation intact. Face, tongue, palate moves normally and  symmetrically.  Motor: Normal bulk and tone. Normal strength in all tested extremity muscles. Sensory.: intact to touch , pinprick , position and vibratory sensation.  Mild hyperesthesia to touch in the right lateral leg. Coordination: Rapid alternating movements normal in all extremities. Finger-to-nose and heel-to-shin performed accurately bilaterally. Gait and Station: Arises from chair without difficulty. Stance is normal. Gait demonstrates normal stride length and balance . Able to heel, toe and tandem walk without difficulty.  Reflexes: 1+ and symmetric. Toes downgoing.      ASSESSMENT: 81 year old Caucasian lady with back and right leg radicular pain due to sciatica from lumbosacral radiculopathy.  Symptoms seem refractory to medical therapy and she may need more aggressive treatment with surgery or further epidural injections.  Remote history of complicated migraine episodes which now appear to be stable.     PLAN:I had a long discussion the patient and her daughter regarding this severe sciatica which appears refractory to current medication regimen.  Recommend she add Topamax 25 mg twice daily and continue gabapentin 400 mg 3 times daily.   Check EMG nerve conduction study as well as keep scheduled appointment with orthopedic surgeon to discuss surgical treatment options.  She will return follow-up in 3 to 4 months or call earlier if necessary.  Greater than 50% time during this 45-minute consultation visit was spent on counseling and coordination of care about her sciatica and leg pain and discussion about treatment and answering questions. Antony Contras, MD  Note: This document was prepared with digital dictation and possible smart phrase technology. Any transcriptional errors that result from this process are unintentional.

## 2021-03-30 NOTE — Telephone Encounter (Signed)
Pt's daughter called states her mothers topiramate (TOPAMAX) 25 MG tablet needs to be sent to [facility]

## 2021-03-31 ENCOUNTER — Telehealth: Payer: Self-pay | Admitting: Physical Medicine and Rehabilitation

## 2021-03-31 NOTE — Addendum Note (Signed)
Addended by: Noberto Retort C on: 03/31/2021 10:51 AM   Modules accepted: Orders

## 2021-03-31 NOTE — Telephone Encounter (Signed)
I spoke to the patient's daughter. Her mother has only tried two doses of the added topiramate. She is going to have her continue the medication. Able to get her NCV/EMG moved up as a work-in on 04/04/21 with Dr. Krista Blue. The results will be helpful for her pending ESI on 04/05/21 with Dr. Ernestina Patches. She wanted to clarify her mother is not established with a Psychologist, sport and exercise. Per vo by Dr. Leonie Man, okay to refer to neurosurgery. Orders placed in Epic.

## 2021-03-31 NOTE — Telephone Encounter (Signed)
Pt's daughter has called back to inform that the topiramate (TOPAMAX) 25 MG tablet  along with [id number] (NEURONTIN) 300 MG capsule has not helped.  Pt's daughter states pt is still riving in pain. Daughter states soon she may have to call and say pt doesn't even need a Dr, she is asking for a call.

## 2021-03-31 NOTE — Telephone Encounter (Signed)
Pt's  husband Jimmie called asking if any one cancels or any way possible to get pt in sooner please call them. Phone number is [phone].

## 2021-04-01 ENCOUNTER — Telehealth: Payer: Self-pay | Admitting: Physical Medicine and Rehabilitation

## 2021-04-01 DIAGNOSIS — M7070 Other bursitis of hip, unspecified hip: Secondary | ICD-10-CM | POA: Diagnosis not present

## 2021-04-01 DIAGNOSIS — M5416 Radiculopathy, lumbar region: Secondary | ICD-10-CM | POA: Diagnosis not present

## 2021-04-01 DIAGNOSIS — I10 Essential (primary) hypertension: Secondary | ICD-10-CM | POA: Diagnosis not present

## 2021-04-01 NOTE — Telephone Encounter (Signed)
Pt and her daughter spoke with PCP and they believe it the issue is stemming from the right hip and would like to change the location of the inj to the right hip instead of the back if that is possible. The best call back number [phone].

## 2021-04-04 ENCOUNTER — Ambulatory Visit (INDEPENDENT_AMBULATORY_CARE_PROVIDER_SITE_OTHER): Payer: Medicare HMO | Admitting: Neurology

## 2021-04-04 ENCOUNTER — Ambulatory Visit: Payer: Medicare HMO | Admitting: Neurology

## 2021-04-04 ENCOUNTER — Other Ambulatory Visit (HOSPITAL_BASED_OUTPATIENT_CLINIC_OR_DEPARTMENT_OTHER): Payer: Self-pay

## 2021-04-04 DIAGNOSIS — M5431 Sciatica, right side: Secondary | ICD-10-CM | POA: Diagnosis not present

## 2021-04-04 DIAGNOSIS — M79604 Pain in right leg: Secondary | ICD-10-CM | POA: Diagnosis not present

## 2021-04-04 MED ORDER — DULOXETINE HCL 60 MG PO CPEP
60.0000 mg | ORAL_CAPSULE | Freq: Every day | ORAL | 6 refills | Status: DC
  Filled 2021-04-04: qty 30, 30d supply, fill #0

## 2021-04-04 MED ORDER — GABAPENTIN 300 MG PO CAPS
600.0000 mg | ORAL_CAPSULE | Freq: Four times a day (QID) | ORAL | 6 refills | Status: DC
  Filled 2021-04-04: qty 240, 30d supply, fill #0
  Filled 2021-05-13: qty 240, 30d supply, fill #1
  Filled 2021-05-31 – 2021-06-06 (×3): qty 240, 30d supply, fill #2
  Filled 2021-08-04: qty 240, 30d supply, fill #3
  Filled 2021-09-01: qty 240, 30d supply, fill #4
  Filled 2021-11-07: qty 240, 30d supply, fill #5
  Filled 2021-12-07: qty 240, 30d supply, fill #6

## 2021-04-04 NOTE — Patient Instructions (Signed)
[facility]    Address: [address], , [place] [address]  Phone: [phone]   

## 2021-04-04 NOTE — Progress Notes (Signed)
ASSESSMENT AND PLAN  Anna Norris is a 81 y.o. female   Subacute onset right gluteus area and right hip pain, radiating along right sciatic nerve distribution  EMG nerve conduction study today showed active denervation involving right tibial,, peroneal, distal sciatic nerve branches,  MRI of lumbar spine would not explain current findings,  Worry about right sciatic nerve pathology, proceed with MRI of femur region with without contrast, with emphasized on right sciatic nerve  Also ordered x-ray of right femur, and right hip, with her complaints of multiple areas of tenderness along right proximal and distal leg  Increase gabapentin to 300 mg 2 tablets 4 times a day, add on Cymbalta 60 mg daily   DIAGNOSTIC DATA (LABS, IMAGING, TESTING) - I reviewed patient records, labs, notes, testing and imaging myself where available.   MEDICAL HISTORY:  Anna Norris is a 81 year old female, seen in request by my colleague Dr. Leonie Man for evaluation of an EMG nerve conduction study, her primary care physician is Dr. Melinda Crutch  I reviewed and summarized the referring note.  Past medical history Diabetes Chronic migraine  She received shingle booster shot at her left arm on January 13, 2021, she could clearly remember the day onset of her right gluteal region and hip pain, it was on January 15, 2021, she organized a birthday party, she felt achiness at the right gluteus and right hip region, deep uncomfortable,  Her right lower extremity pain gradually getting worse over the next few weeks, she took a beach trip early November, began to notice increased pain, but Thanksgiving, she had a moderate to severe constant pain, centered at right gluteus region, right lateral hip, radiating towards right hamstring, right lower extremity, today during examination, she complains multiple area pain involving right lower extremity, including top of her right foot, right calf, right inner thigh, hamstring area,  especially at the right hip, gluteus region  She denies left lower extremity involvement, denies bowel and bladder incontinence,  Personally reviewed MRI of lumbar spine, L4-5 chronic disc degeneration, endplate osteophyte, narrowing of lateral recess, more on the right, no evidence of nerve compression, no significant canal stenosis  She was given low-dose gabapentin without improving her symptoms  Prior to symptom onset, she has no difficulty, 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 Pupils are round equal and briskly reactive to light. CN III, IV, VI: extraocular movement are normal. No ptosis. CN V: Facial sensation is intact to light touch CN VII: Face is symmetric with normal eye closure  CN VIII: Hearing is normal to causal conversation. CN IX, X: Phonation is normal. CN XI: Head turning and shoulder shrug are intact  MOTOR: Upper extremity, and left lower extremity motor strength is normal, right lower extremity strength is limited due to significant right gluteus region and right lateral hip pain  Right hip flexion 4, right hip adduction/abduction 4 (limited due to pain), right knee flexion 4, right knee extension 5, right ankle dorsiflexion 4+ right toe flexion 4 extension4, ankle inversion 4+ eversion, 4+  REFLEXES: Reflexes are 2+ and symmetric at the biceps, triceps, knees, and absent at ankles. Plantar responses are flexor.  SENSORY: Length dependent decreased vibratory sensation light  touch pinprick to distal shin level,  COORDINATION: There is no trunk or limb dysmetria noted.  GAIT/STANCE: Need push-up to get up from seated position, antalgic, dragging right leg, cannot stand up on  right tiptoe or heel  REVIEW OF SYSTEMS:  Full 14 system review of systems performed and notable only for as above All other review of systems were negative.   ALLERGIES: Allergies  Allergen Reactions   Naproxen Other (See Comments)   Novocain [Procaine]    Other Other (See Comments)    Mold - headaches    Peanut Oil     Other reaction(s): rash   Shellfish Allergy Swelling   Strawberry (Diagnostic) Other (See Comments)   Strawberry Extract Swelling    Strawberry with shrimp made lips swollen   Topiramate     Other reaction(s): belly pain   Yellow Dye Hives   Yellow Dyes (Non-Tartrazine) Hives    HOME MEDICATIONS: Current Outpatient Medications  Medication Sig Dispense Refill   DULoxetine (CYMBALTA) 60 MG capsule Take 1 capsule (60 mg total) by mouth daily. 30 capsule 6   albuterol (PROVENTIL HFA;VENTOLIN HFA) 108 (90 Base) MCG/ACT inhaler Inhale into the lungs.     clopidogrel (PLAVIX) 75 MG tablet Take 1 tablet (75 mg total) by mouth daily. 30 tablet 11   diazepam (VALIUM) 5 MG tablet Please take 30 minutes prior to procedure. May repeat x 1. 2 tablet 0   gabapentin (NEURONTIN) 300 MG capsule Take 2 capsules (600 mg total) by mouth 4 (four) times daily. 240 capsule 6   glimepiride (AMARYL) 2 MG tablet Take 2 mg by mouth daily before breakfast.     lidocaine (LIDODERM) 5 %      lidocaine (LIDODERM) 5 % Place 1 patch onto the skin daily. Remove & Discard patch within 12 hours or as directed by MD 30 patch 0   meloxicam (MOBIC) 7.5 MG tablet Take 1 tablet (7.5 mg total) by mouth 2 (two) times daily as needed. 45 tablet 1   metFORMIN (GLUCOPHAGE) 500 MG tablet Take 1,000 mg by mouth 2 (two) times daily with a meal.     PROAIR HFA 108 (90 BASE) MCG/ACT inhaler Inhale 2 puffs into the lungs every 4 (four) hours as needed.      No current facility-administered medications for this visit.    PAST MEDICAL HISTORY: Past Medical History:  Diagnosis Date   Asthma    Complication  of anesthesia    can not have novacain   Cystoid macular edema of left eye 12/22/2019   Diabetes mellitus    A1C < 7   Headache    Tachycardia    no problems now.   Vision abnormalities    hole in left eye    PAST SURGICAL HISTORY: Past Surgical History:  Procedure Laterality Date   BREAST LUMPECTOMY     FOOT SURGERY     HAND SURGERY     TUBAL LIGATION     uterine cyst      FAMILY HISTORY: Family History  Problem Relation Age of Onset   Heart failure Father    Lymphoma Brother    Stroke Neg Hx     SOCIAL HISTORY: Social History   Socioeconomic History   Marital status: Married    Spouse name: Not on file   Number of children: Not on file   Years of education: Not on file   Highest education level: Not on file  Occupational History   Not on file  Tobacco  Use   Smoking status: Never   Smokeless tobacco: Never  Substance and Sexual Activity   Alcohol use: Yes    Comment: once a year   Drug use: No   Sexual activity: Not on file  Other Topics Concern   Not on file  Social History Narrative   Not on file   Social Determinants of Health   Financial Resource Strain: Not on file  Food Insecurity: Not on file  Transportation Needs: Not on file  Physical Activity: Not on file  Stress: Not on file  Social Connections: Not on file  Intimate Partner Violence: Not on file      Marcial Pacas, M.D. Ph.D.  [facility] [address] [place], [place] [address] Ph: [phone] Fax: [phone]  CC:  Lawerance Cruel, MD [place],  [place] [address]  Lawerance Cruel, MD

## 2021-04-04 NOTE — Procedures (Signed)
Full Name: Anna Norris Gender: Female MRN #: [id number] Date of Birth: 1940/11/26    Visit Date: 04/04/2021 12:41 Age: 81 Years Examining Physician: Marcial Pacas, MD  Referring Physician: Antony Contras, MD Height: 5 feet 6 inch Patient History: 8lbs  History: 81 year old female, presenting with subacute onset right posterior gluteal area right hip pain, radiating pain to right lower extremity, muscle weakness,  On examination: Right hip flexion4, right hip adduction/abduction 4 (proximal right lower extremity muscle strength was limited due to pain), right knee flexion4, extension5, right ankle dorsiflexion 5- right toe flexion/extension 4.  Deep tendon reflex, symmetric bilateral patellar, absent ankle  Sensory examination showed length dependent decreased light touch pinprick to distal shin level  Summary of the test: Nerve conduction study: Bilateral sural, superficial peroneal sensory responses were absent. Left tibial, peroneal to EDB motor responses were within normal limit.  Right ulnar sensory and motor responses were normal.  Right peroneal to the EDB showed significantly decreased CMAP amplitude; right tibial motor responses showed normal CMAP amplitude at distal stimulation side, significantly decreased CMAP amplitude at proximal stimulation site, with evidence of temporal dispersion, slow conduction velocity.  Left tibial right ulnar F-wave latency were within normal limit.  Right tibial F-wave latency was moderately prolonged.  Electromyography:  Selected needle examination of bilateral lower extremity muscles, bilateral lumbosacral paraspinal muscles were performed.  There was evidence of active denervation, with neuropathic changes involving right distal tibial, common peroneal distributions.  In addition, there is also evidence of mild chronic neuropathic changes involving distal sciatic nerve such as branch to right biceps femoris short head, biceps femoris  long head.  There was no spontaneous activity at bilateral lumbosacral paraspinals.   Conclusion: This is an abnormal study.  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  There is no evidence of right lumbar radiculopathy.   ------------------------------- Marcial Pacas, M.D. PhD  [facility] [address], [place], [place] [address] Tel: [phone] Fax: [phone]  Verbal informed consent was obtained from the patient, patient was informed of potential risk of procedure, including bruising, bleeding, hematoma formation, infection, muscle weakness, muscle pain, numbness, among others.        [place]    Nerve / Sites Muscle Latency Ref. Amplitude Ref. Rel Amp Segments Distance Velocity Ref. Area    ms ms mV mV %  cm m/s m/s mVms  R Ulnar - ADM     Wrist ADM 3.2 ?3.3 11.4 ?6.0 100 Wrist - ADM 7   28.6     B.Elbow ADM 6.8  9.3  81.3 B.Elbow - Wrist 18 51 ?49 24.0     A.Elbow ADM 8.7  8.4  90.5 A.Elbow - B.Elbow 10 51 ?49 21.8  L Peroneal - EDB     Ankle EDB 4.3 ?6.5 2.0 ?2.0 100 Ankle - EDB 9   13.5     Fib head EDB 11.6  1.7  85.2 Fib head - Ankle 26 36 ?44 12.6     Pop fossa EDB 14.6  1.6  97.3 Pop fossa - Fib head 10 34 ?44 12.6         Pop fossa - Ankle      R Peroneal - EDB     Ankle EDB 6.2 ?6.5 0.3 ?2.0 100 Ankle - EDB 9   2.0     Fib head EDB 14.5  0.4  145 Fib head - Ankle  26 31 ?44 1.4     Pop fossa EDB 17.7  0.5  112 Pop fossa - Fib head 10 31 ?44 1.7         Pop fossa - Ankle      L Tibial - AH     Ankle AH 4.0 ?5.8 8.3 ?4.0 100 Ankle - AH 9   19.3     Pop fossa AH 13.2  6.7  81.2 Pop fossa - Ankle 38 41 ?41 19.1  R Tibial - AH     Ankle AH 5.7 ?5.8 5.1 ?4.0 100 Ankle - AH 9   12.1     Pop fossa AH 19.2  1.3  25.5 Pop fossa - Ankle 38 28 ?41 6.0               SNC    Nerve / Sites Rec. Site Peak Lat Ref.  Amp Ref. Segments Distance    ms ms V  V  cm  L Sural - Ankle (Calf)     Calf Ankle NR ?4.4 NR ?6 Calf - Ankle 14  R Sural - Ankle (Calf)     Calf Ankle NR ?4.4 NR ?6 Calf - Ankle 14  L Superficial peroneal - Ankle     Lat leg Ankle NR ?4.4 NR ?6 Lat leg - Ankle 14  R Superficial peroneal - Ankle     Lat leg Ankle NR ?4.4 NR ?6 Lat leg - Ankle 14  R Ulnar - Orthodromic, (Dig V, Mid palm)     Dig V Wrist 3.0 ?3.1 7 ?5 Dig V - Wrist 51               F  Wave    Nerve F Lat Ref.   ms ms  L Tibial - AH 53.5 ?56.0  R Tibial - AH 61.7 ?56.0  R Ulnar - ADM 28.9 ?32.0           H Reflex    Nerve H Lat   ms   Left Right Ref.  Tibial - [place]oleus 43.8 NR ?35.0                EMG Summary Table    Spontaneous MUAP Recruitment  Muscle IA Fib PSW Fasc Other Amp Dur. Poly Pattern  R. Tibialis anterior Increased 1+ None None _______ Increased Increased 1+ Reduced  R. Tibialis posterior Increased 1+ None None _______ Increased Increased 1+ Reduced  R. Gastrocnemius (Medial head) Increased 1+ 1+ None _______ Increased Increased 1+ Reduced  R. Vastus lateralis Normal None None None _______ Normal Normal Normal Normal  L. Tibialis anterior Normal None None None _______ Normal Normal Normal Normal  L. Tibialis posterior Normal None None None _______ Normal Normal Normal Normal  L. Gastrocnemius (Medial head) Normal None None None _______ Normal Normal Normal Normal  L. Peroneus longus Normal None None None _______ Normal Normal Normal Normal  L. Vastus lateralis Normal None None None _______ Normal Normal Normal Normal  R. Biceps femoris (short head) Increased None None None _______ Normal Increased 1+ Reduced  R. Biceps femoris (long head) Increased None None None _______ Normal Normal Normal Reduced  R. Adductor longus Normal None None None _______ Normal Normal Normal Normal  R. Iliopsoas Normal None None None _______ Normal Normal Normal Normal  R. Gluteus medius Normal None None None _______ Normal Normal Normal Normal  R.  Gluteus maximus Normal None None None _______ Normal Normal Normal Normal  R. Peroneus longus Increased  1+ None None _______ Increased Increased Normal Reduced  R. Lumbar paraspinals (mid) Normal None None None _______ Normal Normal Normal Normal  R. Lumbar paraspinals (low) Normal None None None _______ Normal Normal Normal Normal  L. Lumbar paraspinals (low) Normal None None None _______ Normal Normal Normal Normal  L. Lumbar paraspinals (mid) Normal None None None _______ Normal Normal Normal Normal

## 2021-04-05 ENCOUNTER — Ambulatory Visit
Admission: RE | Admit: 2021-04-05 | Discharge: 2021-04-05 | Disposition: A | Discharge status: Home / Self Care | Payer: Medicare HMO | Source: Ambulatory Visit | Attending: Neurology | Admitting: Neurology

## 2021-04-05 ENCOUNTER — Ambulatory Visit: Payer: Medicare HMO | Admitting: Physical Medicine and Rehabilitation

## 2021-04-05 ENCOUNTER — Telehealth: Payer: Self-pay | Admitting: Physical Medicine and Rehabilitation

## 2021-04-05 ENCOUNTER — Other Ambulatory Visit: Payer: Self-pay

## 2021-04-05 ENCOUNTER — Telehealth: Payer: Self-pay | Admitting: Neurology

## 2021-04-05 DIAGNOSIS — M25551 Pain in right hip: Secondary | ICD-10-CM | POA: Diagnosis not present

## 2021-04-05 DIAGNOSIS — M5431 Sciatica, right side: Secondary | ICD-10-CM

## 2021-04-05 NOTE — Telephone Encounter (Signed)
Patient returned call asked for a call back.   Ph# [phone]

## 2021-04-05 NOTE — Telephone Encounter (Signed)
[employer] pending uploaded notes on the portal  

## 2021-04-06 ENCOUNTER — Telehealth: Payer: Self-pay | Admitting: Neurology

## 2021-04-06 NOTE — Telephone Encounter (Signed)
Pt LVM Dr. Krista Blue sent me to imaging center on Monday 04/04/21 after was there for a couple of nerve test. Sent to get x-rays and MRI, but they was closed on Monday because was a holiday. But got xray o Tuesday and they cannot make MRI appts. Told me to get in touch with the doctor and let her know to schedule with someone else.They do not schedule MRIs. Would like a call from the nurse

## 2021-04-06 NOTE — Telephone Encounter (Signed)
The order has been sent to GI, they will reach out to the patient to schedule. .there already is a phone note on this.

## 2021-04-06 NOTE — Telephone Encounter (Signed)
Anna Norris: [id number] (exp. 04/05/21 to 05/05/21) order sent to GI, they will reach out to the patient to schedule.

## 2021-04-07 ENCOUNTER — Ambulatory Visit: Payer: Medicare HMO | Admitting: Physical Medicine and Rehabilitation

## 2021-04-08 ENCOUNTER — Ambulatory Visit: Payer: Medicare HMO | Admitting: Physical Medicine and Rehabilitation

## 2021-04-08 ENCOUNTER — Encounter: Payer: Self-pay | Admitting: Physical Medicine and Rehabilitation

## 2021-04-08 ENCOUNTER — Other Ambulatory Visit: Payer: Self-pay

## 2021-04-08 VITALS — BP 162/81 | HR 91

## 2021-04-08 DIAGNOSIS — R269 Unspecified abnormalities of gait and mobility: Secondary | ICD-10-CM | POA: Diagnosis not present

## 2021-04-08 DIAGNOSIS — M4726 Other spondylosis with radiculopathy, lumbar region: Secondary | ICD-10-CM

## 2021-04-08 DIAGNOSIS — M48061 Spinal stenosis, lumbar region without neurogenic claudication: Secondary | ICD-10-CM | POA: Diagnosis not present

## 2021-04-08 DIAGNOSIS — M5416 Radiculopathy, lumbar region: Secondary | ICD-10-CM

## 2021-04-08 NOTE — Progress Notes (Signed)
Anna Norris - 81 y.o. female MRN [id number]  Date of birth: [birth year]  Office Visit Note: Visit Date: 04/08/2021 PCP: Lawerance Cruel, MD Referred by: Rosemarie Ax, MD  Subjective: Chief Complaint  Patient presents with   Lower Back - Pain   Right Leg - Pain   Right Foot - Pain   Right Thigh - Pain   HPI: Anna Norris is a 81 y.o. female who comes in today per the request of Dr. Clearance Coots for evaluation of right sided lower back pain radiating to buttock and down posterior leg to anterolateral calf and toes. If nerve root related this seems more like an S1 dermatome pattern. Patient reports pain has been ongoing for several months. Patient reports pain is exacerbated by movement and activity, describes as a constant sore and sharp sensation, currently rates as 8 out of 10. Patient reports some relief of pain with home exercise regimen, back brace and rest. Patient states she has tried multiple medications to help with pain such as Cymbalta and Tylenol, but reports intolerance/allergies to a number of medications. Patients lumbar MRI from 2022 exhibits chronic disc degeneration with loss of disc height at L4-L5, there is also bilateral lateral recess and foraminal narrowing, greater on right than left that could be affecting the exiting right L4 nerve root. No high grade spinal canal stenosis noted. Patient was recently treated by Dr. Clearance Coots at [place] whom did perform bilateral sacroiliac joint injections with some relief of pain. Patient also had right L4-L5 interlaminar epidural steroid injection at [place] with no relief of pain. Patient was also seen by Dr. Antony Contras and after reviewing his notes he does think that her pain pattern is more consistent with S1 radiculopathy. Dr. Leonie Man did refer patient for NVC/EMG of right lower extremity and also placed a referral for surgical consultation. Patients right lower  extremity NCV with EMG performed by Dr. Marcial Pacas exhibits right sciatic neuropathy in the background of mild length dependent axonal sensorimotor polyneuropathy. There is no evidence of right lumbar radiculopathy. Dr. Krista Blue did place order for MRI of the right femur that is scheduled for 04/19/2021. Patient is currently using a cane to assist with ambulation and prevent falls. Patient states her severe pain is significantly affecting her life and makes daily tasks difficult to accomplish. Patient denies focal weakness. Patient denies recent trauma or falls.   Patient course is complicated by TIA, complicated migraine, diabetes mellitus and polyneuropathy.   Review of Systems  Musculoskeletal:  Positive for back pain.  Neurological:  Negative for tingling, tremors, focal weakness and weakness.  All other systems reviewed and are negative. Otherwise per HPI.  Assessment & Plan: Visit Diagnoses:    ICD-10-CM   1. Lumbar radiculopathy  M54.16     2. Other spondylosis with radiculopathy, lumbar region  M47.26     3. Gait abnormality  R26.9     4. Stenosis of lateral recess of lumbar spine  M48.061     5. Foraminal stenosis of lumbar region  M48.061        Plan: Findings:  Chronic, worsening and severe right sided lower back pain radiating to buttock down to posterior leg, anterolateral calf and toes. Patient continues to have excruciating and severe pain despite good conservative therapies such as home exercise regimen, rest, back brace and use of medications. Patients clinical presentation and exam do not directly correlate with her lumbar MRI findings. The differential  diagnosis is S1 radiculopathy, ischial bursitis or sciatic nerve palsy or a combination.  She may have some underlying central sensitization pain syndrome.  Patient is scheduled for MRI of right femur on 1/31. We feel the next step is to wait until right femur MRI imaging is complete and review the study. We would consider  performing a diagnostic and hopefully therapeutic right L4 transforaminal epidural steroid injection if warranted and she does have lateral recess and foraminal narrowing on the right at this level. Conisder other interventions depending on imaging. We will review right femur MRI imaging and encouraged patient to follow up with [place] as needed. Patient encouraged to continue home exercises and conservative therapies as tolerated. No red flag symptoms noted upon exam today.    Meds & Orders: No orders of the defined types were placed in this encounter.  No orders of the defined types were placed in this encounter.   Follow-up: Return if symptoms worsen or fail to improve.   Procedures: No procedures performed      Clinical History: MRI LUMBAR SPINE WITHOUT CONTRAST   TECHNIQUE: Multiplanar, multisequence MR imaging of the lumbar spine was performed. No intravenous contrast was administered.   COMPARISON:  Radiography 02/01/2021   FINDINGS: Segmentation:  5 lumbar type vertebral bodies.   Alignment:  No significant malalignment.   Vertebrae:  No fracture or focal bone lesion.   Conus medullaris and cauda equina: Conus extends to the L1 level. Conus and cauda equina appear normal.   Paraspinal and other soft tissues: Negative   Disc levels:   No significant finding at L1-2 or above.   L2-3: Minimal disc bulge.  No stenosis.   L3-4: Mild disc bulge slightly more prominent towards the left. No compressive stenosis.   L4-5: Chronic disc degeneration with loss of disc height. Endplate osteophytes and mild bulging of the disc. Mild narrowing of the lateral recesses and of the foramina, right more than left. Definite neural compression is not demonstrated, but there is some potential the right L4 nerve could be affected in the narrowed foramen.   L5-S1: Bulging of the disc slightly more prominent towards the left. No compressive stenosis.   IMPRESSION: At L4-5, there is chronic  disc degeneration with loss of disc height. There are endplate osteophytes and minimal bulging of the disc. There is mild narrowing of the lateral recesses and neural foramina, more on the right than the left. Definite neural compression is not established, but there is some potential the exiting right L4 nerve could be affected.     Electronically Signed   By: Nelson Chimes M.D.   On: 03/07/2021 11:58   She reports that she has never smoked. She has never used smokeless tobacco. No results for input(s): HGBA1C, LABURIC in the last 8760 hours.  Objective:  VS:  HT:     WT:    BMI:      BP:(!) 162/81   HR:91bpm   TEMP: ( )   RESP:  Physical Exam Vitals and nursing note reviewed.  HENT:     Head: Normocephalic and atraumatic.     Right Ear: External ear normal.     Left Ear: External ear normal.     Nose: Nose normal.     Mouth/Throat:     Mouth: Mucous membranes are moist.  Eyes:     Extraocular Movements: Extraocular movements intact.  Cardiovascular:     Rate and Rhythm: Normal rate.     Pulses: Normal pulses.  Pulmonary:  Effort: Pulmonary effort is normal.  Abdominal:     General: Abdomen is flat. There is no distension.  Musculoskeletal:        General: Tenderness present.     Cervical back: Normal range of motion.     Comments: Pt is slow to rise from seated position to standing. Good lumbar range of motion. Strong distal strength without clonus, no pain upon palpation of greater trochanters. Sensation intact bilaterally. Dysesthesias noted to right S1 dermatome. Right anterolateral calf region is tender upon palpation. Ambulates with cane, gait slow and unsteady.  Skin:    General: Skin is warm and dry.     Capillary Refill: Capillary refill takes less than 2 seconds.  Neurological:     Mental Status: She is alert and oriented to person, place, and time.     Gait: Gait abnormal.  Psychiatric:        Mood and Affect: Mood normal.        Behavior: Behavior normal.     Ortho Exam  Imaging: No results found.  Past Medical/Family/Surgical/Social History: Medications & Allergies reviewed per EMR, new medications updated. Patient Active Problem List   Diagnosis Date Noted   Right leg pain 04/04/2021   Anxiety 03/30/2021   Hearing loss 03/30/2021   Heart murmur 03/30/2021   Hypertension 03/30/2021   Nausea and vomiting 03/30/2021   Osteopenia 03/30/2021   Polyneuropathy due to type 2 diabetes mellitus ([place]) 03/30/2021   Sciatica 03/30/2021   Sacroiliac joint dysfunction of right side 02/09/2021   Hamstring tendinitis at origin 02/09/2021   Greater trochanteric pain syndrome of right lower extremity 02/09/2021   Lumbar radiculopathy 02/09/2021   Nuclear sclerotic cataract of right eye 10/19/2020   Moderate nonproliferative diabetic retinopathy of left eye with macular edema associated with type 2 diabetes mellitus ([place]) 12/22/2019   Cystoid macular edema of left eye 12/22/2019   Lamellar macular hole of left eye 12/22/2019   Early stage nonexudative age-related macular degeneration of both eyes 12/22/2019   Right ankle pain 03/26/2014   Headache 05/08/2013   Aphasia 05/08/2013   Other and unspecified hyperlipidemia 08/14/2012   TIA (transient ischemic attack) 04/26/2012   DM2 (diabetes mellitus, type 2) ([place]) 04/26/2012   Altered mental status 04/26/2012   Past Medical History:  Diagnosis Date   Asthma    Complication of anesthesia    can not have novacain   Cystoid macular edema of left eye 12/22/2019   Diabetes mellitus    A1C < 7   Headache    Tachycardia    no problems now.   Vision abnormalities    hole in left eye   Family History  Problem Relation Age of Onset   Heart failure Father    Lymphoma Brother    Stroke Neg Hx    Past Surgical History:  Procedure Laterality Date   BREAST LUMPECTOMY     FOOT SURGERY     HAND SURGERY     TUBAL LIGATION     uterine cyst     Social History   Occupational History   Not on  file  Tobacco Use   Smoking status: Never   Smokeless tobacco: Never  Substance and Sexual Activity   Alcohol use: Yes    Comment: once a year   Drug use: No   Sexual activity: Not on file

## 2021-04-08 NOTE — Progress Notes (Signed)
Pt state lower back pain that travels down her right legs and calf to the foot. Pt state any movement makes her pain worse. Pt state she takes pain meds and uses heat to help ease her pain.  Numeric Pain Rating Scale and Functional Assessment Average Pain 10 Pain Right Now 6 My pain is intermittent, sharp, burning, dull, stabbing, and aching Pain is worse with: walking, bending, sitting, standing, and some activites Pain improves with: heat/ice and medication   In the last MONTH (on 0-10 scale) has pain interfered with the following?  1. General activity like being  able to carry out your everyday physical activities such as walking, climbing stairs, carrying groceries, or moving a chair?  Rating(7)  2. Relation with others like being able to carry out your usual social activities and roles such as  activities at home, at work and in your community. Rating(8)  3. Enjoyment of life such that you have  been bothered by emotional problems such as feeling anxious, depressed or irritable?  Rating(9)

## 2021-04-12 ENCOUNTER — Ambulatory Visit: Payer: Medicare HMO | Admitting: Neurology

## 2021-04-18 ENCOUNTER — Ambulatory Visit
Admission: RE | Admit: 2021-04-18 | Discharge: 2021-04-18 | Disposition: A | Discharge status: Home / Self Care | Payer: Medicare HMO | Source: Ambulatory Visit | Attending: Neurology | Admitting: Neurology

## 2021-04-18 ENCOUNTER — Other Ambulatory Visit: Payer: Self-pay

## 2021-04-18 DIAGNOSIS — M79651 Pain in right thigh: Secondary | ICD-10-CM | POA: Diagnosis not present

## 2021-04-18 DIAGNOSIS — M5431 Sciatica, right side: Secondary | ICD-10-CM

## 2021-04-18 MED ORDER — GADOBENATE DIMEGLUMINE 529 MG/ML IV SOLN
14.0000 mL | Freq: Once | INTRAVENOUS | Status: AC | PRN
  Administered 2021-04-18: 14 mL via INTRAVENOUS

## 2021-04-19 ENCOUNTER — Encounter: Payer: Self-pay | Admitting: Physical Medicine and Rehabilitation

## 2021-04-19 ENCOUNTER — Telehealth: Payer: Self-pay | Admitting: Neurology

## 2021-04-19 ENCOUNTER — Telehealth: Payer: Self-pay | Admitting: Physical Medicine and Rehabilitation

## 2021-04-19 ENCOUNTER — Ambulatory Visit: Payer: Medicare HMO | Admitting: Physical Medicine and Rehabilitation

## 2021-04-19 VITALS — BP 134/86 | HR 88

## 2021-04-19 DIAGNOSIS — R269 Unspecified abnormalities of gait and mobility: Secondary | ICD-10-CM | POA: Diagnosis not present

## 2021-04-19 DIAGNOSIS — M48061 Spinal stenosis, lumbar region without neurogenic claudication: Secondary | ICD-10-CM

## 2021-04-19 DIAGNOSIS — M5416 Radiculopathy, lumbar region: Secondary | ICD-10-CM

## 2021-04-19 DIAGNOSIS — M4726 Other spondylosis with radiculopathy, lumbar region: Secondary | ICD-10-CM

## 2021-04-19 NOTE — Telephone Encounter (Signed)
Pt returned phone call, would like a call back.  

## 2021-04-19 NOTE — Progress Notes (Signed)
Anna Norris - 81 y.o. female MRN [id number]  Date of birth: Jun 17, 1940  Office Visit Note: Visit Date: 04/19/2021 PCP: Lawerance Cruel, MD Referred by: Lawerance Cruel, MD  Subjective: No chief complaint on file.  HPI: Anna Norris is a 81 y.o. female who comes in today for evaluation of chronic, worsening and severe right sided lower back pain radiating to buttock and down posterior leg to anterolateral calf and toes.  Patient reports pain has been ongoing for several months.  Patient states pain is exacerbated by movement and activity, she describes pain as a sharp and sore sensation, currently rates as 9 out of 10. Patient reports she has tried multiple medications, home exercise regimens and back brace however these treatments do not seem to help alleviate her pain. Patients lumbar MRI from 2022 exhibits chronic disc degeneration with loss of disc height at L4-L5, there is also bilateral lateral recess and foraminal narrowing, greater on right than left that could be affecting the exiting right L4 nerve root. No high grade spinal canal stenosis noted. Patient was recently treated by Dr. Clearance Coots at [place] whom did perform bilateral sacroiliac joint injections with some relief of pain. Patient also had right L4-L5 interlaminar epidural steroid injection at [place] with no relief of pain. Patient was also recently seen by Dr. Antony Contras at [facility] and had right lower extremity NCV with EMG performed by Dr. Marcial Pacas that exhibits right sciatic neuropathy in the background of mild length dependent axonal sensorimotor polyneuropathy. Patient was recently sent for MRI of the right femur due to concern for sciatic nerve palsy.  Patient states her pain continues to be excruciating and she is becoming increasingly frustrated that nothing seems to help alleviate her pain.  Patient denies focal weakness, numbness  and tingling.  Patient denies recent trauma or falls.         Review of Systems  Musculoskeletal:  Positive for back pain.  Neurological:  Negative for tingling, sensory change, focal weakness and weakness.  All other systems reviewed and are negative. Otherwise per HPI.  Assessment & Plan: Visit Diagnoses:    ICD-10-CM   1. Lumbar radiculopathy  M54.16     2. Other spondylosis with radiculopathy, lumbar region  M47.26     3. Stenosis of lateral recess of lumbar spine  M48.061     4. Foraminal stenosis of lumbar region  M48.061     5. Gait abnormality  R26.9        Plan: Findings:  Chronic, worsening and severe right-sided lower back pain radiating to buttock and down posterior leg to anterolateral calf and toes.  Patient continues to have excruciating and debilitating pain despite good conservative therapy such as home exercise regimen, use of back brace and medications.  Patient's clinical presentation and exam continue to be complex and do not correlate to her recent lumbar MRI findings.  The differential diagnoses would include S1 radiculopathy and ischial bursitis.  Patient's recent MRI of the right femur exhibits no significant abnormality, however we cannot completely exclude sciatic nerve palsy as her recent nerve study did exhibit right sciatic neuropathy.  We did speak with patient about possible treatment options and recommend performing a diagnostic and hopefully therapeutic right L4 transforaminal epidural steroid injection under fluoroscopic guidance as she does have lateral recess and foraminal narrowing on the right at this level.  Patient states she does not wish to undergo lumbar epidural  steroid injection at this time as her previous injection did not help to alleviate her pain.  We did explain to patient that we would be performing a different type of diagnostic injection however she insists on holding off on this injection at this time.  We recommend that patient  follow-up with Dr. Leonie Man to discuss further treatment options.  We are happy to see patient back if she decides she would like to proceed with lumbar epidural steroid injection.  No red flag symptoms noted upon exam today.      Meds & Orders: No orders of the defined types were placed in this encounter.  No orders of the defined types were placed in this encounter.   Follow-up: Return if symptoms worsen or fail to improve.   Procedures: No procedures performed      Clinical History: MRI LUMBAR SPINE WITHOUT CONTRAST   TECHNIQUE: Multiplanar, multisequence MR imaging of the lumbar spine was performed. No intravenous contrast was administered.   COMPARISON:  Radiography 02/01/2021   FINDINGS: Segmentation:  5 lumbar type vertebral bodies.   Alignment:  No significant malalignment.   Vertebrae:  No fracture or focal bone lesion.   Conus medullaris and cauda equina: Conus extends to the L1 level. Conus and cauda equina appear normal.   Paraspinal and other soft tissues: Negative   Disc levels:   No significant finding at L1-2 or above.   L2-3: Minimal disc bulge.  No stenosis.   L3-4: Mild disc bulge slightly more prominent towards the left. No compressive stenosis.   L4-5: Chronic disc degeneration with loss of disc height. Endplate osteophytes and mild bulging of the disc. Mild narrowing of the lateral recesses and of the foramina, right more than left. Definite neural compression is not demonstrated, but there is some potential the right L4 nerve could be affected in the narrowed foramen.   L5-S1: Bulging of the disc slightly more prominent towards the left. No compressive stenosis.   IMPRESSION: At L4-5, there is chronic disc degeneration with loss of disc height. There are endplate osteophytes and minimal bulging of the disc. There is mild narrowing of the lateral recesses and neural foramina, more on the right than the left. Definite neural compression is  not established, but there is some potential the exiting right L4 nerve could be affected.     Electronically Signed   By: Nelson Chimes M.D.   On: 03/07/2021 11:58   She reports that she has never smoked. She has never used smokeless tobacco. No results for input(s): HGBA1C, LABURIC in the last 8760 hours.  Objective:  VS:  HT:     WT:    BMI:      BP:134/86   HR:88bpm   TEMP: ( )   RESP:  Physical Exam Vitals and nursing note reviewed.  HENT:     Head: Normocephalic and atraumatic.     Right Ear: External ear normal.     Left Ear: External ear normal.     Nose: Nose normal.     Mouth/Throat:     Mouth: Mucous membranes are moist.  Eyes:     Extraocular Movements: Extraocular movements intact.  Cardiovascular:     Rate and Rhythm: Normal rate.     Pulses: Normal pulses.  Pulmonary:     Effort: Pulmonary effort is normal.  Abdominal:     General: Abdomen is flat. There is no distension.  Musculoskeletal:        General: Tenderness present.  Cervical back: Normal range of motion.     Comments: Pt is slow to rise from seated position to standing. Good lumbar range of motion. Strong distal strength without clonus, no pain upon palpation of greater trochanters. Sensation intact bilaterally. Dysesthesias noted to right S1 dermatome. Right anterolateral calf region is tender upon palpation. Ambulates with cane, gait slow and unsteady  Skin:    General: Skin is warm and dry.     Capillary Refill: Capillary refill takes less than 2 seconds.  Neurological:     Mental Status: She is alert and oriented to person, place, and time.     Gait: Gait abnormal.  Psychiatric:        Mood and Affect: Mood normal.        Behavior: Behavior normal.    Ortho Exam  Imaging: No results found.  Past Medical/Family/Surgical/Social History: Medications & Allergies reviewed per EMR, new medications updated. Patient Active Problem List   Diagnosis Date Noted   Right leg pain 04/04/2021    Anxiety 03/30/2021   Hearing loss 03/30/2021   Heart murmur 03/30/2021   Hypertension 03/30/2021   Nausea and vomiting 03/30/2021   Osteopenia 03/30/2021   Polyneuropathy due to type 2 diabetes mellitus ([place]) 03/30/2021   Sciatica 03/30/2021   Sacroiliac joint dysfunction of right side 02/09/2021   Hamstring tendinitis at origin 02/09/2021   Greater trochanteric pain syndrome of right lower extremity 02/09/2021   Lumbar radiculopathy 02/09/2021   Nuclear sclerotic cataract of right eye 10/19/2020   Moderate nonproliferative diabetic retinopathy of left eye with macular edema associated with type 2 diabetes mellitus ([place]) 12/22/2019   Cystoid macular edema of left eye 12/22/2019   Lamellar macular hole of left eye 12/22/2019   Early stage nonexudative age-related macular degeneration of both eyes 12/22/2019   Right ankle pain 03/26/2014   Headache 05/08/2013   Aphasia 05/08/2013   Other and unspecified hyperlipidemia 08/14/2012   TIA (transient ischemic attack) 04/26/2012   DM2 (diabetes mellitus, type 2) ([place]) 04/26/2012   Altered mental status 04/26/2012   Past Medical History:  Diagnosis Date   Asthma    Complication of anesthesia    can not have novacain   Cystoid macular edema of left eye 12/22/2019   Diabetes mellitus    A1C < 7   Headache    Tachycardia    no problems now.   Vision abnormalities    hole in left eye   Family History  Problem Relation Age of Onset   Heart failure Father    Lymphoma Brother    Stroke Neg Hx    Past Surgical History:  Procedure Laterality Date   BREAST LUMPECTOMY     FOOT SURGERY     HAND SURGERY     TUBAL LIGATION     uterine cyst     Social History   Occupational History   Not on file  Tobacco Use   Smoking status: Never   Smokeless tobacco: Never  Substance and Sexual Activity   Alcohol use: Yes    Comment: once a year   Drug use: No   Sexual activity: Not on file

## 2021-04-19 NOTE — Telephone Encounter (Signed)
Patent would like to know if her results are in before she comes to her appointment

## 2021-04-19 NOTE — Telephone Encounter (Signed)
Pt would like a call from the nurse to discuss having severe pain on right side from the waist down.  Right now having pain level is 7, but at night pain level is 10+. Seen orthopedic physician today, he said everything look good, but I'm still having pain.  Advised patient to go to the ER. Patient said she will go to the ER.

## 2021-04-19 NOTE — Telephone Encounter (Signed)
Left message for a return call. ______________________________________  Note from last visit on 03/30/21:  ASSESSMENT: 81 year old Caucasian lady with back and right leg radicular pain due to sciatica from lumbosacral radiculopathy.  Symptoms seem refractory to medical therapy and she may need more aggressive treatment with surgery or further epidural injections.   NCV/EMG results from 04/04/21:  Conclusion: This is an abnormal study.  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  There is no evidence of right lumbar radiculopathy

## 2021-04-19 NOTE — Progress Notes (Signed)
Pt state lower back pain that travels down her right legs and calf to the foot. Pt state any movement makes her pain worse. Pt state she takes pain meds and uses heat to help ease her pain. Pt here for MRI review.  Numeric Pain Rating Scale and Functional Assessment Average Pain 10 Pain Right Now 8 My pain is intermittent, sharp, burning, dull, stabbing, tingling, and aching Pain is worse with: walking, bending, sitting, standing, and some activites Pain improves with: medication   In the last MONTH (on 0-10 scale) has pain interfered with the following?  1. General activity like being  able to carry out your everyday physical activities such as walking, climbing stairs, carrying groceries, or moving a chair?  Rating(7)  2. Relation with others like being able to carry out your usual social activities and roles such as  activities at home, at work and in your community. Rating(8)  3. Enjoyment of life such that you have  been bothered by emotional problems such as feeling anxious, depressed or irritable?  Rating(9)

## 2021-04-20 ENCOUNTER — Telehealth: Payer: Self-pay | Admitting: Neurology

## 2021-04-20 ENCOUNTER — Telehealth: Payer: Self-pay | Admitting: Family Medicine

## 2021-04-20 NOTE — Telephone Encounter (Signed)
Pt called states she was unable to get Epidural when previously scheduled & now is being told that a New Order will have to be submitted by Dr.Schmitz before one can be re- scheduled.  Forwarding request to provider for review & ordering--Epidural w/ Dr.Fred Ernestina Patches @ [place]

## 2021-04-20 NOTE — Telephone Encounter (Signed)
IMPRESSION: No significant abnormality identified.   Please call patient, right femur showed no significant abnormality, there was no mention of right sciatic nerve signal abnormality  Please check with patient, to see if she has any improvement in her symptoms.

## 2021-04-20 NOTE — Telephone Encounter (Signed)
I called pt- She was seen at Dr. Kennon Portela office on 04/19/21. He recommends another epidural. Patient does not wish to have another epidural. She states the 03/16/21 epidural did not help.   She wants to know if you can consult with Dr. Ernestina Patches or advise if you can help her get some relief another way. She is using assistive devices and still in pain.

## 2021-04-20 NOTE — Telephone Encounter (Signed)
I spoke to the patient. She had an appt with Dr. Ernestina Patches on 04/20/21 ([facility] Says he suggested another ESI. She told me the first one did not help much. I informed her it is not uncommon for patients to have repeat epidural steroid injections for pain management. In some cases, subsequent injections can provide better relief. She may need that type of treatment, in combination with oral medications, for a continual management of symptoms.  I do not believe she should go to the ED. This is chronic pain and they will not be able to provide a long term solution. I encouraged her to call Dr. Romona Curls office and follow his medical advice for treatment. She was in agreement with this plan.

## 2021-04-20 NOTE — Telephone Encounter (Signed)
I spoke to the patient. She is aware of results. ___________________________________ She called yesterday with reports of significant pain (note in Epic). She missed our return call. She was considering going to the ED. I was able to speak further with her today:   She had an appt with Dr. Ernestina Norris on 04/20/21 ([place]). Says he suggested another ESI. She told me the first one did not help much. I informed her it is not uncommon for patients to have repeat epidural steroid injections for pain management. In some cases, subsequent injections can provide better relief. She may need that type of treatment, in combination with oral medications, for a continual management of symptoms.   I do not believe she should go to the ED. This is chronic pain and they will not be able to provide a long term solution. I encouraged her to call Dr. Romona Norris office and follow his medical advice for treatment. She was in agreement with this plan.

## 2021-04-22 NOTE — Telephone Encounter (Signed)
Has an EMG, MRI of femur and lumbar spine. Still having ongoing pain.   Rosemarie Ax, MD [facility] 04/22/2021, 8:39 AM

## 2021-04-25 ENCOUNTER — Ambulatory Visit: Payer: Self-pay

## 2021-04-25 ENCOUNTER — Ambulatory Visit: Payer: Medicare HMO | Admitting: Family Medicine

## 2021-04-25 VITALS — BP 152/80 | Ht 66.0 in | Wt 152.0 lb

## 2021-04-25 DIAGNOSIS — G5701 Lesion of sciatic nerve, right lower limb: Secondary | ICD-10-CM

## 2021-04-25 MED ORDER — TRIAMCINOLONE ACETONIDE 40 MG/ML IJ SUSP
40.0000 mg | Freq: Once | INTRAMUSCULAR | Status: AC
  Administered 2021-04-25: 40 mg via INTRA_ARTICULAR

## 2021-04-25 NOTE — Patient Instructions (Signed)
Good to see you Please use heat   Please send me a message in MyChart with any questions or updates.  Please let me know how you're feeling later this week. .   --Dr. Raeford Razor

## 2021-04-25 NOTE — Assessment & Plan Note (Signed)
Acutely occurring.  Symptoms are radicular with little improvement with therapy thus far. -Counseled on home exercise therapy and supportive care. -Preforms injection today.

## 2021-04-25 NOTE — Progress Notes (Signed)
°  Anna Norris - 81 y.o. female MRN [id number]  Date of birth: 1940-11-28  SUBJECTIVE:  Including CC & ROS.  No chief complaint on file.   Anna Norris is a 81 y.o. female that is presenting with acute worsening of her right-sided gluteal pain and radicular pain.  She has tried SI joint injection as well as L4-L5 interlaminar epidural.  EMG was demonstrating right-sided sciatic neuropathy.  Independent review of the right femur MRI from 1/30 shows no acute changes.   Review of Systems See HPI   HISTORY: Past Medical, Surgical, Social, and Family History Reviewed & Updated per EMR.   Pertinent Historical Findings include:  Past Medical History:  Diagnosis Date   Asthma    Complication of anesthesia    can not have novacain   Cystoid macular edema of left eye 12/22/2019   Diabetes mellitus    A1C < 7   Headache    Tachycardia    no problems now.   Vision abnormalities    hole in left eye    Past Surgical History:  Procedure Laterality Date   BREAST LUMPECTOMY     FOOT SURGERY     HAND SURGERY     TUBAL LIGATION     uterine cyst       PHYSICAL EXAM:  VS: BP (!) 152/80 (BP Location: Left Arm, Patient Position: Sitting)    Ht 5\' 6"  (1.676 m)    Wt 152 lb (68.9 kg)    BMI 24.53 kg/m  Physical Exam Gen: NAD, alert, cooperative with exam, well-appearing MSK:  Neurovascularly intact     Aspiration/Injection Procedure Note Anna Norris [birth year]  Procedure: Injection Indications: Right gluteal pain  Procedure Details Consent: Risks of procedure as well as the alternatives and risks of each were explained to the (patient/caregiver).  Consent for procedure obtained. Time Out: Verified patient identification, verified procedure, site/side was marked, verified correct patient position, special equipment/implants available, medications/allergies/relevent history reviewed, required imaging and test results available.  Performed.  The area was cleaned with iodine and  alcohol swabs.    The right piriformis was injected using 3 cc of 1% lidocaine on a 22-gauge 3-1/2 inch needle.  The syringe was switched and a mixture containing 1 cc's of 40 mg Kenalog and 4 cc's of 0.25% bupivacaine was injected.  Ultrasound was needed in order to visualize the needle into the muscle joint to avoid damage to other structures in the area.  Ultrasound was used. Images were obtained in long views showing the injection.     A sterile dressing was applied.  Patient did tolerate procedure well.     ASSESSMENT & PLAN:   Piriformis syndrome of right side Acutely occurring.  Symptoms are radicular with little improvement with therapy thus far. -Counseled on home exercise therapy and supportive care. -Preforms injection today.

## 2021-04-28 ENCOUNTER — Telehealth: Payer: Self-pay | Admitting: Family Medicine

## 2021-04-28 NOTE — Telephone Encounter (Signed)
Answered questions.   Rosemarie Ax, MD [facility] 04/28/2021, 4:55 PM

## 2021-04-28 NOTE — Telephone Encounter (Signed)
Patient called states still in pain-- LOV 04/25/21 injection didn't help w/ back pain .  --Forwarding message to provider for review & to call patient back w/ recommendations.  --glh

## 2021-04-29 ENCOUNTER — Ambulatory Visit: Payer: Self-pay

## 2021-04-29 ENCOUNTER — Encounter: Payer: Self-pay | Admitting: Family Medicine

## 2021-04-29 ENCOUNTER — Ambulatory Visit: Payer: Medicare HMO | Admitting: Family Medicine

## 2021-04-29 VITALS — BP 140/80 | Ht 66.0 in | Wt 152.0 lb

## 2021-04-29 DIAGNOSIS — M533 Sacrococcygeal disorders, not elsewhere classified: Secondary | ICD-10-CM

## 2021-04-29 MED ORDER — TRIAMCINOLONE ACETONIDE 40 MG/ML IJ SUSP
40.0000 mg | Freq: Once | INTRAMUSCULAR | Status: AC
  Administered 2021-04-29: 40 mg via INTRA_ARTICULAR

## 2021-04-29 NOTE — Patient Instructions (Signed)
Good to see you Please use ice as needed  Please send me a message in MyChart with any questions or updates.  Please see me back in 3-4 weeks.   --Dr. Raeford Razor

## 2021-04-29 NOTE — Progress Notes (Signed)
°  Anna Norris - 81 y.o. female MRN [id number]  Date of birth: Oct 20, 1940  SUBJECTIVE:  Including CC & ROS.  No chief complaint on file.   Anna Norris is a 81 y.o. female that is presenting with worsening of her right leg pain.  She did get improvement with anesthesia of the piriformis syndrome injection.  Pain is still severe in nature.   Review of Systems See HPI   HISTORY: Past Medical, Surgical, Social, and Family History Reviewed & Updated per EMR.   Pertinent Historical Findings include:  Past Medical History:  Diagnosis Date   Asthma    Complication of anesthesia    can not have novacain   Cystoid macular edema of left eye 12/22/2019   Diabetes mellitus    A1C < 7   Headache    Tachycardia    no problems now.   Vision abnormalities    hole in left eye    Past Surgical History:  Procedure Laterality Date   BREAST LUMPECTOMY     FOOT SURGERY     HAND SURGERY     TUBAL LIGATION     uterine cyst       PHYSICAL EXAM:  VS: BP 140/80 (BP Location: Left Arm, Patient Position: Sitting)    Ht 5\' 6"  (1.676 m)    Wt 152 lb (68.9 kg)    BMI 24.53 kg/m  Physical Exam Gen: NAD, alert, cooperative with exam, well-appearing MSK:  Neurovascularly intact     Aspiration/Injection Procedure Note Anna Norris [birth year]  Procedure: Injection Indications: Right SI joint pain.  Procedure Details Consent: Risks of procedure as well as the alternatives and risks of each were explained to the (patient/caregiver).  Consent for procedure obtained. Time Out: Verified patient identification, verified procedure, site/side was marked, verified correct patient position, special equipment/implants available, medications/allergies/relevent history reviewed, required imaging and test results available.  Performed.  The area was cleaned with iodine and alcohol swabs.    The right SI joint was injected using 3 cc of 1% lidocaine on a 22-gauge 3-1/2 inch needle.  The syringe was switched  and a mixture containing 1 cc's of 40 mg Kenalog and 4 cc's of 0.25% bupivacaine was injected.  Ultrasound was used. Images were obtained in long views showing the injection.     A sterile dressing was applied.  Patient did tolerate procedure well.     ASSESSMENT & PLAN:   Sacroiliac joint dysfunction of right side Worsening of her pain.  She did get improvement with the piriformis injection with anesthesia but pain returned acutely. -Counseled on home exercise therapy and supportive care. -Injection today. -May need to consider referral for surgery.

## 2021-04-29 NOTE — Addendum Note (Signed)
Addended by: Cresenciano Lick on: 04/29/2021 11:50 AM   Modules accepted: Orders

## 2021-04-29 NOTE — Assessment & Plan Note (Signed)
Worsening of her pain.  She did get improvement with the piriformis injection with anesthesia but pain returned acutely. -Counseled on home exercise therapy and supportive care. -Injection today. -May need to consider referral for surgery.

## 2021-05-04 ENCOUNTER — Telehealth: Payer: Self-pay | Admitting: Neurology

## 2021-05-04 ENCOUNTER — Telehealth: Payer: Self-pay | Admitting: Family Medicine

## 2021-05-04 DIAGNOSIS — M5416 Radiculopathy, lumbar region: Secondary | ICD-10-CM

## 2021-05-04 NOTE — Telephone Encounter (Signed)
Pt is asking for a call to discuss the results of the MR and NCS and EMG

## 2021-05-04 NOTE — Telephone Encounter (Signed)
Spoke with patient about her ongoing pain.  Will refer to neurosurgery.  Rosemarie Ax, MD [facility] 05/04/2021, 3:13 PM

## 2021-05-05 ENCOUNTER — Telehealth: Payer: Self-pay | Admitting: Family Medicine

## 2021-05-05 NOTE — Telephone Encounter (Signed)
Pt cld states left msg 2/13 that she is still in pain, injection rcvd 2/10 only lasted 2 dys --Patient request a call back w/ instruction  or What is the next step in Treatment of back pain -- Forwarding message to med asst for review w/provider.  --glh

## 2021-05-05 NOTE — Addendum Note (Signed)
Addended by: Noberto Retort C on: 05/05/2021 11:30 AM   Modules accepted: Orders

## 2021-05-05 NOTE — Telephone Encounter (Signed)
She did not need to discuss her results further. She had some questions about the discomfort from her chronic pain.  She stopped duloxetine 60mg  due to nausea.  She will continue her gabapentin 300mg , 2 caps QID.  She will pick up the meloxicam 7.5mg  sent to the pharmacy by Dr. Raeford Razor (informed her to not take this medication in combination with ibuprofen). She will also move forward with the repeat ESI that she says he has recommended.  She will keep her pending follow up with Dr. Leonie Man.

## 2021-05-10 NOTE — Telephone Encounter (Signed)
Received fax from [place] stating Dr. Owens Shark requires a MRI or CT myelogram to be completed before he sees patient.    Please order required imaging and we will fax back to Dr. Owens Shark @ [phone].  Ph# [phone]

## 2021-05-10 NOTE — Telephone Encounter (Signed)
MRI L Spine and MRI right femur faxed to number below.

## 2021-05-13 ENCOUNTER — Other Ambulatory Visit (HOSPITAL_BASED_OUTPATIENT_CLINIC_OR_DEPARTMENT_OTHER): Payer: Self-pay

## 2021-05-16 ENCOUNTER — Telehealth: Payer: Self-pay | Admitting: Family Medicine

## 2021-05-16 NOTE — Telephone Encounter (Signed)
Patient called states she is still in significant pain -- Referral provider cant scheduled appt til April & pt ask if a different injection can be given to relieve pain till Neuro appt  in April.  --Forwarding message to med asst for review w/ Dr. Raeford Razor.  Patient request a call back.@ [phone] --Anna Norris

## 2021-05-16 NOTE — Telephone Encounter (Signed)
Answered questions.   Rosemarie Ax, MD [facility] 05/16/2021, 5:27 PM

## 2021-05-17 ENCOUNTER — Encounter: Payer: Self-pay | Admitting: Family Medicine

## 2021-05-17 ENCOUNTER — Ambulatory Visit: Payer: Self-pay

## 2021-05-17 ENCOUNTER — Ambulatory Visit: Payer: Medicare HMO | Admitting: Family Medicine

## 2021-05-17 VITALS — BP 124/62 | Ht 66.0 in | Wt 152.0 lb

## 2021-05-17 DIAGNOSIS — M1611 Unilateral primary osteoarthritis, right hip: Secondary | ICD-10-CM

## 2021-05-17 DIAGNOSIS — G5701 Lesion of sciatic nerve, right lower limb: Secondary | ICD-10-CM

## 2021-05-17 MED ORDER — KETOROLAC TROMETHAMINE 30 MG/ML IJ SOLN
30.0000 mg | Freq: Once | INTRAMUSCULAR | Status: AC
  Administered 2021-05-17: 30 mg via INTRA_ARTICULAR

## 2021-05-17 NOTE — Progress Notes (Signed)
°  Anna Norris - 81 y.o. female MRN [id number]  Date of birth: 1940-08-08  SUBJECTIVE:  Including CC & ROS.  No chief complaint on file.   Anna Norris is a 81 y.o. female that is  presenting with acute worsening of her hip and left pain.  No improvement with epidural.  Did get a couple hours of improvement with piriformis injection.  Pain continues and is severe in nature.  She is having altered sensation down the leg.  She is also in pain in the groin.   Review of Systems See HPI   HISTORY: Past Medical, Surgical, Social, and Family History Reviewed & Updated per EMR.   Pertinent Historical Findings include:  Past Medical History:  Diagnosis Date   Asthma    Complication of anesthesia    can not have novacain   Cystoid macular edema of left eye 12/22/2019   Diabetes mellitus    A1C < 7   Headache    Tachycardia    no problems now.   Vision abnormalities    hole in left eye    Past Surgical History:  Procedure Laterality Date   BREAST LUMPECTOMY     FOOT SURGERY     HAND SURGERY     TUBAL LIGATION     uterine cyst       PHYSICAL EXAM:  VS: BP 124/62 (BP Location: Left Arm, Patient Position: Sitting)    Ht 5\' 6"  (1.676 m)    Wt 152 lb (68.9 kg)    BMI 24.53 kg/m  Physical Exam Gen: NAD, alert, cooperative with exam, well-appearing MSK:  Neurovascularly intact     Aspiration/Injection Procedure Note Anna Norris 1940/06/20  Procedure: Injection Indications: Right hip pain  Procedure Details Consent: Risks of procedure as well as the alternatives and risks of each were explained to the (patient/caregiver).  Consent for procedure obtained. Time Out: Verified patient identification, verified procedure, site/side was marked, verified correct patient position, special equipment/implants available, medications/allergies/relevent history reviewed, required imaging and test results available.  Performed.  The area was cleaned with iodine and alcohol swabs.    The  right hip joint was injected using 3 cc of 1% lidocaine on a 22-gauge 3-1/2 inch needle.  The syringe was switched and a mixture containing 1 cc's of 30 mg Toradol and 4 cc's of 0.25% bupivacaine was injected  Ultrasound was used. Images were obtained in long views showing the injection.     A sterile dressing was applied.  Patient did tolerate procedure well.     ASSESSMENT & PLAN:   Primary osteoarthritis of right hip Acutely occurring.  May be contributing to her ongoing pain. -Counseled on home exercise therapy and supportive care. -Injection.  Piriformis syndrome of right side Acutely occurring.  Pain has been present since November.  Pain is severe and constant in nature.  EMG has been showing sciatic neuropathy but no evidence of right lumbar radiculopathy.  Previous x-ray of the pelvis and hips and lumbar MRI are unrevealing for a source.  She did get mild alleviation of her pain with a piriformis injection with anesthesia.  This was short-lived. -Counseled on home exercise therapy and supportive care. -MRI of the pelvis to evaluate for mass pressing on piriformis and for presurgical planning.Marland Kitchen

## 2021-05-17 NOTE — Assessment & Plan Note (Signed)
Acutely occurring.  May be contributing to her ongoing pain. -Counseled on home exercise therapy and supportive care. -Injection.

## 2021-05-17 NOTE — Patient Instructions (Signed)
Good to see you Please use ice as needed  We'll let you know where we're referring.   Please send me a message in MyChart with any questions or updates.  Please see me back in 4 weeks.   --Dr. Raeford Razor

## 2021-05-18 NOTE — Assessment & Plan Note (Signed)
Acutely occurring.  Pain has been present since November.  Pain is severe and constant in nature.  EMG has been showing sciatic neuropathy but no evidence of right lumbar radiculopathy.  Previous x-ray of the pelvis and hips and lumbar MRI are unrevealing for a source.  She did get mild alleviation of her pain with a piriformis injection with anesthesia.  This was short-lived. ?-Counseled on home exercise therapy and supportive care. ?-MRI of the pelvis to evaluate for mass pressing on piriformis and for presurgical planning.Marland Kitchen ?

## 2021-05-23 ENCOUNTER — Ambulatory Visit: Payer: Medicare HMO | Admitting: Family Medicine

## 2021-05-25 ENCOUNTER — Other Ambulatory Visit: Payer: Medicare HMO

## 2021-05-25 ENCOUNTER — Other Ambulatory Visit (HOSPITAL_BASED_OUTPATIENT_CLINIC_OR_DEPARTMENT_OTHER): Payer: Self-pay

## 2021-05-26 ENCOUNTER — Encounter: Payer: Medicare HMO | Admitting: Diagnostic Neuroimaging

## 2021-05-27 ENCOUNTER — Ambulatory Visit
Admission: RE | Admit: 2021-05-27 | Discharge: 2021-05-27 | Disposition: A | Discharge status: Home / Self Care | Payer: Medicare HMO | Source: Ambulatory Visit | Attending: Family Medicine | Admitting: Family Medicine

## 2021-05-27 ENCOUNTER — Other Ambulatory Visit: Payer: Self-pay

## 2021-05-27 ENCOUNTER — Ambulatory Visit: Payer: Medicare HMO | Admitting: Family Medicine

## 2021-05-27 ENCOUNTER — Telehealth: Payer: Self-pay | Admitting: *Deleted

## 2021-05-27 DIAGNOSIS — M5136 Other intervertebral disc degeneration, lumbar region: Secondary | ICD-10-CM | POA: Diagnosis not present

## 2021-05-27 DIAGNOSIS — M5137 Other intervertebral disc degeneration, lumbosacral region: Secondary | ICD-10-CM | POA: Diagnosis not present

## 2021-05-27 DIAGNOSIS — M160 Bilateral primary osteoarthritis of hip: Secondary | ICD-10-CM | POA: Diagnosis not present

## 2021-05-27 DIAGNOSIS — G5701 Lesion of sciatic nerve, right lower limb: Secondary | ICD-10-CM

## 2021-05-27 DIAGNOSIS — D259 Leiomyoma of uterus, unspecified: Secondary | ICD-10-CM | POA: Diagnosis not present

## 2021-05-27 DIAGNOSIS — M1611 Unilateral primary osteoarthritis, right hip: Secondary | ICD-10-CM

## 2021-05-27 NOTE — Telephone Encounter (Signed)
-----   Message from Rosemarie Ax, MD sent at 05/27/2021  8:10 AM EST ----- ?Regarding: RE: phone message ?We are getting an MRI of her pelvis and then can send her to Dr. Olin Hauser at [facility] that is a PM[facility]R ?----- Message ----- ?From: Cresenciano Lick, CMA ?Sent: 05/26/2021  12:19 PM EST ?To: Rosemarie Ax, MD ?Subject: FW: phone message                             ? ?Did you speak to someone about this? ?----- Message ----- ?From: Carolyne Littles ?Sent: 05/26/2021   9:37 AM EST ?To: Cresenciano Lick, CMA ?Subject: phone message                                 ? ?Pt called stating she has MRI tomorrow, was told she'd be referred to ortho. Pt is asking if an appt been made yet? ? ? ? ?

## 2021-05-27 NOTE — Telephone Encounter (Signed)
Left detailed message informing pt of below. She is supposed to be having her MRI today. ?

## 2021-05-31 ENCOUNTER — Encounter: Payer: Self-pay | Admitting: Family Medicine

## 2021-05-31 ENCOUNTER — Other Ambulatory Visit (HOSPITAL_BASED_OUTPATIENT_CLINIC_OR_DEPARTMENT_OTHER): Payer: Self-pay

## 2021-05-31 ENCOUNTER — Ambulatory Visit (INDEPENDENT_AMBULATORY_CARE_PROVIDER_SITE_OTHER): Payer: Medicare HMO | Admitting: Family Medicine

## 2021-05-31 VITALS — BP 148/78 | Ht 66.0 in | Wt 152.0 lb

## 2021-05-31 DIAGNOSIS — G5701 Lesion of sciatic nerve, right lower limb: Secondary | ICD-10-CM | POA: Diagnosis not present

## 2021-05-31 NOTE — Telephone Encounter (Signed)
Patient had visit today with Dr. Raeford Razor. Referral placed and faxed to Dr. Eulogio Ditch.  ?

## 2021-05-31 NOTE — Progress Notes (Signed)
?  Anna Norris - 81 y.o. female MRN [id number]  Date of birth: 1941/01/19 ? ?SUBJECTIVE:  Including CC & ROS.  ?No chief complaint on file. ? ? ?Anna Norris is a 81 y.o. female that is following up for the MRI of her pelvis.  This was showing some strain of the gluteus medius as well as changes in the hamstring tendons. ? ? ?Review of Systems ?See HPI  ? ?HISTORY: Past Medical, Surgical, Social, and Family History Reviewed & Updated per EMR.   ?Pertinent Historical Findings include: ? ?Past Medical History:  ?Diagnosis Date  ? Asthma   ? Complication of anesthesia   ? can not have novacain  ? Cystoid macular edema of left eye 12/22/2019  ? Diabetes mellitus   ? A1C < 7  ? Headache   ? Tachycardia   ? no problems now.  ? Vision abnormalities   ? hole in left eye  ? ? ?Past Surgical History:  ?Procedure Laterality Date  ? BREAST LUMPECTOMY    ? FOOT SURGERY    ? HAND SURGERY    ? TUBAL LIGATION    ? uterine cyst    ? ? ? ?PHYSICAL EXAM:  ?VS: BP (!) 148/78 (BP Location: Left Arm, Patient Position: Sitting)   Ht '5\' 6"'$  (1.676 m)   Wt 152 lb (68.9 kg)   BMI 24.53 kg/m?  ?Physical Exam ?Gen: NAD, alert, cooperative with exam, well-appearing ?MSK:  ?Neurovascularly intact   ? ? ? ? ?ASSESSMENT & PLAN:  ? ?Piriformis syndrome of right side ?MRI of the pelvis was unrevealing for a mass or tumor within the pelvis.  Fairly benign exam. ?-Counseled on home exercise therapy and supportive care. ?-Referral to PM&R. ? ? ? ? ?

## 2021-05-31 NOTE — Assessment & Plan Note (Signed)
MRI of the pelvis was unrevealing for a mass or tumor within the pelvis.  Fairly benign exam. ?-Counseled on home exercise therapy and supportive care. ?-Referral to PM&R. ?

## 2021-06-01 ENCOUNTER — Telehealth: Payer: Self-pay | Admitting: Family Medicine

## 2021-06-01 NOTE — Telephone Encounter (Signed)
Patient called says Dr.Willowby can't see her till 06/20/21 & that's 3 weeks out,doesn't know what to do in mean time for pain.(She is on wait list) ? ?---Forwarding message to provider pt wishes him to contact them for sooner Appt. ? ?--glh ?

## 2021-06-04 ENCOUNTER — Other Ambulatory Visit: Payer: Medicare HMO

## 2021-06-06 ENCOUNTER — Other Ambulatory Visit (HOSPITAL_BASED_OUTPATIENT_CLINIC_OR_DEPARTMENT_OTHER): Payer: Self-pay

## 2021-06-07 DIAGNOSIS — M545 Low back pain, unspecified: Secondary | ICD-10-CM | POA: Diagnosis not present

## 2021-06-14 DIAGNOSIS — M5416 Radiculopathy, lumbar region: Secondary | ICD-10-CM | POA: Diagnosis not present

## 2021-06-16 ENCOUNTER — Emergency Department (HOSPITAL_BASED_OUTPATIENT_CLINIC_OR_DEPARTMENT_OTHER): Payer: Medicare HMO

## 2021-06-16 ENCOUNTER — Other Ambulatory Visit: Payer: Self-pay

## 2021-06-16 ENCOUNTER — Encounter (HOSPITAL_BASED_OUTPATIENT_CLINIC_OR_DEPARTMENT_OTHER): Payer: Self-pay | Admitting: Emergency Medicine

## 2021-06-16 ENCOUNTER — Emergency Department (HOSPITAL_BASED_OUTPATIENT_CLINIC_OR_DEPARTMENT_OTHER)
Admission: EM | Admit: 2021-06-16 | Discharge: 2021-06-16 | Disposition: A | Discharge status: Home / Self Care | Payer: Medicare HMO | Attending: Emergency Medicine | Admitting: Emergency Medicine

## 2021-06-16 DIAGNOSIS — M7989 Other specified soft tissue disorders: Secondary | ICD-10-CM | POA: Insufficient documentation

## 2021-06-16 DIAGNOSIS — J45909 Unspecified asthma, uncomplicated: Secondary | ICD-10-CM | POA: Insufficient documentation

## 2021-06-16 DIAGNOSIS — E119 Type 2 diabetes mellitus without complications: Secondary | ICD-10-CM | POA: Insufficient documentation

## 2021-06-16 DIAGNOSIS — M79604 Pain in right leg: Secondary | ICD-10-CM

## 2021-06-16 DIAGNOSIS — M79671 Pain in right foot: Secondary | ICD-10-CM | POA: Insufficient documentation

## 2021-06-16 MED ORDER — OXYCODONE-ACETAMINOPHEN 5-325 MG PO TABS
1.0000 | ORAL_TABLET | Freq: Four times a day (QID) | ORAL | 0 refills | Status: DC | PRN
Start: 2021-06-16 — End: 2021-07-06

## 2021-06-16 MED ORDER — OXYCODONE-ACETAMINOPHEN 5-325 MG PO TABS
1.0000 | ORAL_TABLET | Freq: Once | ORAL | Status: AC
  Administered 2021-06-16: 1 via ORAL
  Filled 2021-06-16: qty 1

## 2021-06-16 NOTE — ED Notes (Signed)
Foot swelling x 3 months, has had numerous xrays and [place] all negative ? ?

## 2021-06-16 NOTE — Discharge Instructions (Signed)
You are seen in the emergency room today with foot and leg pain.  You do not have a blood clot in your legs and the bones of your foot are normal.  Please follow with your primary care doctor.  I am not sure if they have considered sending you to a rheumatologist but this may be a consideration.  The pain medication can cause drowsiness.  You cannot take it along with your gabapentin.  This can also cause constipation and balance issues.  Please take over-the-counter constipation medication while taking Percocet.  Return with any new or suddenly worsening symptoms. ?

## 2021-06-16 NOTE — ED Triage Notes (Signed)
Patient c/o right foot swelling x 3 months. Denies any injury. States she has had X-rays, ultrasounds and multiple MRIs. Had an epidural Tuesday. Has not taken BP meds today.  ?

## 2021-06-16 NOTE — ED Provider Notes (Signed)
? ?Emergency Department Provider Note ? ? ?I have reviewed the triage vital signs and the nursing notes. ? ? ?HISTORY ? ?Chief Complaint ?Foot Swelling ? ? ?HPI ?Anna Norris is a 81 y.o. female with PMH reviewed presents to the ED with right foot pain and swelling. Patient notes progressively worsening pain which initially began in the hip but is now concentrated in the foot. Notes some swelling. No redness or fever. No injury. No numbness. Has been following with sports med with joint [place] and MRI of the pelvis performed recently with no clear diagnosis, per patient. Has been on Gabapentin without relief.   ? ? ?Past Medical History:  ?Diagnosis Date  ? Asthma   ? Complication of anesthesia   ? can not have novacain  ? Cystoid macular edema of left eye 12/22/2019  ? Diabetes mellitus   ? A1C < 7  ? Headache   ? Tachycardia   ? no problems now.  ? Vision abnormalities   ? hole in left eye  ? ? ?Review of Systems ? ?Constitutional: No fever/chills ?Cardiovascular: Denies chest pain. ?Respiratory: Denies shortness of breath. ?Gastrointestinal: No abdominal pain.  No nausea, no vomiting.  No diarrhea.  No constipation. ?Genitourinary: Negative for dysuria. ?Musculoskeletal: Right foot pain and swelling.  ?Skin: Negative for rash. ?Neurological: Negative for headaches, focal weakness or numbness. ? ? ?____________________________________________ ? ? ?PHYSICAL EXAM: ? ?VITAL SIGNS: ?ED Triage Vitals  ?Enc Vitals Group  ?   BP 06/16/21 1827 (!) 191/106  ?   Pulse Rate 06/16/21 1827 83  ?   Resp 06/16/21 1827 18  ?   Temp 06/16/21 1827 98.1 ?F (36.7 ?C)  ?   Temp Source 06/16/21 1827 Oral  ?   SpO2 06/16/21 1827 97 %  ?   Weight 06/16/21 1827 152 lb (68.9 kg)  ?   Height 06/16/21 1827 '5\' 6"'$  (1.676 m)  ? ?Constitutional: Alert and oriented. Well appearing and in no acute distress. ?Eyes: Conjunctivae are normal.  ?Head: Atraumatic. ?Nose: No congestion/rhinnorhea. ?Mouth/Throat: Mucous membranes are moist.   ?Neck: No  stridor.   ?Cardiovascular: Normal rate, regular rhythm. Good peripheral circulation. Grossly normal heart sounds.   ?Respiratory: Normal respiratory effort.  No retractions. Lungs CTAB. ?Gastrointestinal: Soft and nontender. No distention.  ?Musculoskeletal: Swelling to the right foot and ankle diffusely. No erythema or cellulitis. 2+ DP and PT pulses appreciated on the right. No discoloration.  ?Neurologic:  Normal speech and language. No gross focal neurologic deficits are appreciated.  ?Skin:  Skin is warm, dry and intact. No rash noted. ? ?____________________________________________ ? ? ?PROCEDURES ? ?Procedure(s) performed:  ? ?Procedures ? ?None ?____________________________________________ ? ? ?INITIAL IMPRESSION / ASSESSMENT AND PLAN / ED COURSE ? ?Pertinent labs & imaging results that were available during my care of the patient were reviewed by me and considered in my medical decision making (see chart for details). ?  ?This patient is Presenting for Evaluation of leg/foot pain, which does require a range of treatment options, and is a complaint that involves a high risk of morbidity and mortality. ? ?The Differential Diagnoses include DVT, fracture, dislocation, critical limb ischemia. ? ?Critical Interventions-  ?  ?Medications  ?oxyCODONE-acetaminophen (PERCOCET/ROXICET) 5-325 MG per tablet 1 tablet (1 tablet Oral Given 06/16/21 1854)  ? ? ?Reassessment after intervention: Patient with improved pain.  ? ? ?I did obtain Additional Historical Information from husband at bedside. ? ?I decided to review pertinent External Data, and in summary patient with ongoing  outpatient w/u with sports med. ? ? ?Radiologic Tests Ordered, included DVT US and plain films of the foot. I independently interpreted the images and agree with radiology interpretation.  ? ? ?Social Determinants of Health Risk patient is a non-smoker. ? ?Medical Decision Making: Summary:  ?Patient presents to the ED with foot pain and swelling.  No DVT on [place] here. Plain film as above. Pain improved with percocet but will prescribe this with caution to patient. Patient to continue without outpatient f/u.  ? ?Reevaluation with update and discussion with patient and husband. Discussed follow up plan and ED return precautions.  ? ?Disposition: discharge ? ?____________________________________________ ? ?FINAL CLINICAL IMPRESSION(S) / ED DIAGNOSES ? ?Final diagnoses:  ?Right leg pain  ? ? ? ?NEW OUTPATIENT MEDICATIONS STARTED DURING THIS VISIT: ? ?Discharge Medication List as of 06/16/2021  8:05 PM  ?  ? ?START taking these medications  ? Details  ?oxyCODONE-acetaminophen (PERCOCET/ROXICET) 5-325 MG tablet Take 1 tablet by mouth every 6 (six) hours as needed for severe pain., Starting Thu 06/16/2021, Normal  ?  ?  ? ? ?Note:  This document was prepared using Dragon voice recognition software and may include unintentional dictation errors. ? ?Nanda Quinton, MD, FACEP ?Emergency Medicine ? ?  ?Margette Fast, MD ?06/21/21 [phone] ? ?

## 2021-06-20 ENCOUNTER — Other Ambulatory Visit (HOSPITAL_BASED_OUTPATIENT_CLINIC_OR_DEPARTMENT_OTHER): Payer: Self-pay

## 2021-06-20 ENCOUNTER — Other Ambulatory Visit: Payer: Self-pay | Admitting: Family Medicine

## 2021-06-20 MED ORDER — MELOXICAM 7.5 MG PO TABS
7.5000 mg | ORAL_TABLET | Freq: Every day | ORAL | 1 refills | Status: AC
  Filled 2021-06-20: qty 45, 45d supply, fill #0
  Filled 2021-07-25 – 2021-07-28 (×4): qty 45, 45d supply, fill #1

## 2021-07-06 ENCOUNTER — Other Ambulatory Visit (HOSPITAL_BASED_OUTPATIENT_CLINIC_OR_DEPARTMENT_OTHER): Payer: Self-pay

## 2021-07-06 ENCOUNTER — Encounter: Payer: Self-pay | Admitting: Neurology

## 2021-07-06 ENCOUNTER — Ambulatory Visit: Payer: Medicare HMO | Admitting: Neurology

## 2021-07-06 VITALS — BP 124/62 | HR 84

## 2021-07-06 DIAGNOSIS — M5416 Radiculopathy, lumbar region: Secondary | ICD-10-CM | POA: Diagnosis not present

## 2021-07-06 DIAGNOSIS — M79604 Pain in right leg: Secondary | ICD-10-CM | POA: Diagnosis not present

## 2021-07-06 DIAGNOSIS — G5701 Lesion of sciatic nerve, right lower limb: Secondary | ICD-10-CM | POA: Diagnosis not present

## 2021-07-06 MED ORDER — TOPIRAMATE 25 MG PO TABS
25.0000 mg | ORAL_TABLET | Freq: Two times a day (BID) | ORAL | 3 refills | Status: AC
  Filled 2021-07-06 – 2022-02-02 (×3): qty 120, 60d supply, fill #0
  Filled 2022-04-10: qty 120, 60d supply, fill #1
  Filled 2022-06-12: qty 120, 60d supply, fill #2

## 2021-07-06 NOTE — Progress Notes (Signed)
?[facility] ?[address] ?[place]. [place] [address] ?[phone] ? ?     OFFICE FOLLOW UP VISIT NOTE ? ?Ms. Curt Jews ?Date of Birth:  February 20, 1941 ?Medical Record Number:  [id number]  ? ?Referring MD: Melinda Crutch ? ?Reason for Referral: Sciatica ? ?HPI: Initial visit 03/30/2021 Anna Norris is a 81 year old pleasant Caucasian lady seen today for initial consultation visit for right leg radicular pain.  She is accompanied by her daughter.  History is obtained from them and review of electronic medical records.  I reviewed pertinent available imaging films in PACS.  She has past medical history of diabetes, asthma, atypical migraine and who has seen me in the past and was last seen in the office in December 2019 for typical migraine.  Patient is referred back today for new complaint of right leg pain.  She states that this began suddenly on October 30 last year.  She describes the pain as being sharp shooting intermittent starting in the right low back and shooting down her but down the posterior aspect of the thigh and leg into her toes.  At times this pain is unbearable.  Times she has increased sensitivity to touch on the right leg particularly the lateral aspect.  She has been evaluated by primary care physician and referred to sports medicine Dr. Reginal Lutes and she describes a variety of tests including muscle ultrasound and MRI of her spine on 03/07/2021 and I personally reviewed the films which show chronic disc degeneration at L4-5 with right greater than left foraminal narrowing and possible encroachment on the L4 nerve root.  L5-S1 shows only minor degenerative change but no definite nerve root compression.  The patient's clinical symptomatology is more compatible with right S1 radiculopathy but there is no significant definite impingement noted on the MRI she was referred for epidural injection which she had once without any relief.  She has tried variety of nonsteroidal  anti-inflammatory drugs like Mobic, Tylenol without much relief.  Lidocaine spray seems to provide some temporary relief.  She is currently on gabapentin 400 mg 3 times daily which seems to have helped somewhat.  She feels her quality of life is significantly affected and walking is quite painful due to this.  She denies any prior history of back injury, fall, motor vehicle accident or known spinal stenosis or disc problems.  She has an appointment to see orthopedic surgeon to discuss possible surgical treatment options.  She denies any lifting heavy weights or slip or fall pulling or pushing something heavy prior to onset of the symptoms.  She has not yet had an EMG nerve conduction study done. ?Patient has had previous episodes of complicated migraine and 2014 2015 and 2018 and was seen by me at the last office visit being in December 2019.  She has had no further episodes of migraine or complicated migraine since then. ? ?Update 07/06/2021 : She returns for follow-up after last visit 3 months ago.  Patient continues to have significant pain in her thigh and leg which now has spread to 1 involving below the knee and the foot.  She is currently taking gabapentin 600 mg 3 times daily and feels it helps somewhat.  She is also worried about swelling in the leg.  She in fact underwent ultrasound of the right lower extremity on 06/16/2021 which was negative for DVT.  EMG nerve conduction study on 04/04/2021 done by Dr. 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  There is no evidence of lumbosacral radiculopathy.  MRI scan of the pelvis on 05/27/2021 showed intramuscular edema involving gluteus medius and minimus as well as some mild tendinosis at the origin of both hamstring tendons.  Patient has been referred to Dr. Thedore Mins at pain medicine who did epidural steroid injection 3 and half weeks ago which patient's feels  helped from 30 to 35%.  She plans to get more injections soon.  She has been applying lidocaine patches locally which do not help much.  Patient says she is miserable with pain and has to use a wheelchair, walking long distances. ?ROS:   ?14 system review of systems is positive for back pain, leg pain, foot pain and swelling, sciatica, hypersensitivity, numbness all other systems negative ? ?PMH:  ?Past Medical History:  ?Diagnosis Date  ? Asthma   ? Complication of anesthesia   ? can not have novacain  ? Cystoid macular edema of left eye 12/22/2019  ? Diabetes mellitus   ? A1C < 7  ? Headache   ? Tachycardia   ? no problems now.  ? Vision abnormalities   ? hole in left eye  ? ? ?Social History:  ?Social History  ? ?Socioeconomic History  ? Marital status: Married  ?  Spouse name: Not on file  ? Number of children: Not on file  ? Years of education: Not on file  ? Highest education level: Not on file  ?Occupational History  ? Not on file  ?Tobacco Use  ? Smoking status: Never  ? Smokeless tobacco: Never  ?Substance and Sexual Activity  ? Alcohol use: Yes  ?  Comment: once a year  ? Drug use: No  ? Sexual activity: Not on file  ?Other Topics Concern  ? Not on file  ?Social History Narrative  ? Not on file  ? ?Social Determinants of Health  ? ?Financial Resource Strain: Not on file  ?Food Insecurity: Not on file  ?Transportation Needs: Not on file  ?Physical Activity: Not on file  ?Stress: Not on file  ?Social Connections: Not on file  ?Intimate Partner Violence: Not on file  ? ? ?Medications:   ?Current Outpatient Medications on File Prior to Visit  ?Medication Sig Dispense Refill  ? clopidogrel (PLAVIX) 75 MG tablet Take 1 tablet (75 mg total) by mouth daily. 30 tablet 11  ? gabapentin (NEURONTIN) 300 MG capsule Take 2 capsules (600 mg total) by mouth 4 (four) times daily. 240 capsule 6  ? glimepiride (AMARYL) 2 MG tablet Take 2 mg by mouth daily before breakfast.    ? lidocaine (LIDODERM) 5 % Place 1 patch onto the  skin daily. Remove & Discard patch within 12 hours or as directed by MD 30 patch 0  ? meloxicam (MOBIC) 7.5 MG tablet Take 1 tablet (7.5 mg total) by mouth daily. 45 tablet 1  ? metFORMIN (GLUCOPHAGE) 500 MG tablet Take 1,000 mg by mouth 2 (two) times daily with a meal.    ? ?No current facility-administered medications on file prior to visit.  ? ? ?Allergies:   ?Allergies  ?Allergen Reactions  ? Cymbalta [Duloxetine Hcl] Nausea And Vomiting  ? Duloxetine Nausea Only  ? Hydrocodone Other (See Comments)  ?  Makes the veins "pop" out on her legs.   ? Naproxen Other (See Comments)  ? Novocain [Procaine]   ? Other Other (See Comments)  ?  Mold - headaches   ? Peanut Oil   ?  Other reaction(s): rash  ? Shellfish Allergy Swelling  ? Strawberry (Diagnostic) Other (See Comments)  ? Strawberry Extract Swelling  ?  Strawberry with shrimp made lips swollen  ? Topiramate   ?  Other reaction(s): belly pain  ? Tylenol [Acetaminophen] Nausea Only  ? Yellow Dye Hives  ? Yellow Dyes (Non-Tartrazine) Hives  ? ? ?Physical Exam ?General: well developed, well nourished pleasant elderly Caucasian lady, seated, in   evident distress due to leg pain ?Head: head normocephalic and atraumatic.   ?Neck: supple with no carotid or supraclavicular bruits ?Cardiovascular: regular rate and rhythm, no murmurs ?Musculoskeletal: no deformity mild spasm right paraspinal lumbosacral region.  Slight leg raising limited in the right leg due to sciatica ?Skin:  no rash/petichiae ?Vascular:  Normal pulses all extremities ? ?Neurologic Exam ?Mental Status: Awake and fully alert. Oriented to place and time. Recent and remote memory intact. Attention span, concentration and fund of knowledge appropriate. Mood and affect appropriate.  ?Cranial Nerves: Fundoscopic exam not done s. Pupils equal, briskly reactive to light. Extraocular movements full without nystagmus. Visual fields full to confrontation. Hearing intact. Facial sensation intact. Face, tongue,  palate moves normally and symmetrically.  ?Motor: Normal bulk and tone. Normal strength in all tested extremity muscles but right lower extremity testing is limited due to pain.Marland Kitchen ?Sensory.: intact to touch , pinprick ,

## 2021-07-06 NOTE — Patient Instructions (Signed)
I had a long discussion with the patient and her husband regarding her chronic right thigh and leg pain which appears to have now spread to involve most of the right foot as well and discussed results of MRI scans of the spine, pelvis and ultrasound and answered questions.  I recommend she can continue gabapentin and the current dose of 603 times daily and add Topamax 25 mg twice daily and increase as tolerated.  If this is effective may consider reducing gabapentin in the future.  Continue follow-up with Dr. Thedore Mins with pain management with a repeat epidural injections as she seems to have partial relief with it.  Return for follow-up in the future as needed only. ?

## 2021-07-07 ENCOUNTER — Other Ambulatory Visit (HOSPITAL_BASED_OUTPATIENT_CLINIC_OR_DEPARTMENT_OTHER): Payer: Self-pay

## 2021-07-07 DIAGNOSIS — M48061 Spinal stenosis, lumbar region without neurogenic claudication: Secondary | ICD-10-CM | POA: Diagnosis not present

## 2021-07-07 DIAGNOSIS — M5416 Radiculopathy, lumbar region: Secondary | ICD-10-CM | POA: Diagnosis not present

## 2021-07-07 DIAGNOSIS — M5136 Other intervertebral disc degeneration, lumbar region: Secondary | ICD-10-CM | POA: Diagnosis not present

## 2021-07-08 DIAGNOSIS — I10 Essential (primary) hypertension: Secondary | ICD-10-CM | POA: Diagnosis not present

## 2021-07-08 DIAGNOSIS — E7800 Pure hypercholesterolemia, unspecified: Secondary | ICD-10-CM | POA: Diagnosis not present

## 2021-07-08 DIAGNOSIS — E1142 Type 2 diabetes mellitus with diabetic polyneuropathy: Secondary | ICD-10-CM | POA: Diagnosis not present

## 2021-07-11 DIAGNOSIS — E7800 Pure hypercholesterolemia, unspecified: Secondary | ICD-10-CM | POA: Diagnosis not present

## 2021-07-11 DIAGNOSIS — I10 Essential (primary) hypertension: Secondary | ICD-10-CM | POA: Diagnosis not present

## 2021-07-11 DIAGNOSIS — I779 Disorder of arteries and arterioles, unspecified: Secondary | ICD-10-CM | POA: Diagnosis not present

## 2021-07-11 DIAGNOSIS — Z0000 Encounter for general adult medical examination without abnormal findings: Secondary | ICD-10-CM | POA: Diagnosis not present

## 2021-07-11 DIAGNOSIS — M5430 Sciatica, unspecified side: Secondary | ICD-10-CM | POA: Diagnosis not present

## 2021-07-11 DIAGNOSIS — E1142 Type 2 diabetes mellitus with diabetic polyneuropathy: Secondary | ICD-10-CM | POA: Diagnosis not present

## 2021-07-11 DIAGNOSIS — E1151 Type 2 diabetes mellitus with diabetic peripheral angiopathy without gangrene: Secondary | ICD-10-CM | POA: Diagnosis not present

## 2021-07-12 DIAGNOSIS — M5416 Radiculopathy, lumbar region: Secondary | ICD-10-CM | POA: Diagnosis not present

## 2021-07-19 ENCOUNTER — Encounter (INDEPENDENT_AMBULATORY_CARE_PROVIDER_SITE_OTHER): Payer: Self-pay | Admitting: Ophthalmology

## 2021-07-19 ENCOUNTER — Ambulatory Visit (INDEPENDENT_AMBULATORY_CARE_PROVIDER_SITE_OTHER): Payer: Medicare HMO | Admitting: Ophthalmology

## 2021-07-19 DIAGNOSIS — E113312 Type 2 diabetes mellitus with moderate nonproliferative diabetic retinopathy with macular edema, left eye: Secondary | ICD-10-CM

## 2021-07-19 DIAGNOSIS — H35342 Macular cyst, hole, or pseudohole, left eye: Secondary | ICD-10-CM | POA: Diagnosis not present

## 2021-07-19 DIAGNOSIS — E103391 Type 1 diabetes mellitus with moderate nonproliferative diabetic retinopathy without macular edema, right eye: Secondary | ICD-10-CM

## 2021-07-19 DIAGNOSIS — H2511 Age-related nuclear cataract, right eye: Secondary | ICD-10-CM | POA: Diagnosis not present

## 2021-07-19 DIAGNOSIS — E113391 Type 2 diabetes mellitus with moderate nonproliferative diabetic retinopathy without macular edema, right eye: Secondary | ICD-10-CM | POA: Insufficient documentation

## 2021-07-19 NOTE — Assessment & Plan Note (Signed)
OD moderate no progression ?

## 2021-07-19 NOTE — Assessment & Plan Note (Signed)
Moderate OD follow-up Dr. Rutherford Guys as scheduled ?

## 2021-07-19 NOTE — Progress Notes (Addendum)
? ? ?07/19/2021 ? ?  ? ?CHIEF COMPLAINT ?Patient presents for  ?Chief Complaint  ?Patient presents with  ? Diabetic Retinopathy with Macular Edema  ? ? ? ? ?HISTORY OF PRESENT ILLNESS: ?Anna Norris is a 81 y.o. female who presents to the clinic today for:  ? ?HPI   ?9 mos fu ou oct, fp. ?Pt stated vision has been stable but reported pt noticed half vision- right side usually disappears. Pt noticed that symptom about 3 months ago.  ?Pt denies new floaters and FOL. ?Pt has had 3 epidurals since last visit.  ? ? ?Last edited by Silvestre Moment on 07/19/2021 11:09 AM.  ?  ? ? ?Referring physician: ?Rutherford Guys, MD ?[address] ?[place],  [place] [address] ? ?HISTORICAL INFORMATION:  ? ?Selected notes from the [facility] ?  ? ?Lab Results  ?Component Value Date  ? HGBA1C 7.4 (H) 05/09/2013  ?  ? ?CURRENT MEDICATIONS: ?No current outpatient medications on file. (Ophthalmic Drugs)  ? ?No current facility-administered medications for this visit. (Ophthalmic Drugs)  ? ?Current Outpatient Medications (Other)  ?Medication Sig  ? clopidogrel (PLAVIX) 75 MG tablet Take 1 tablet (75 mg total) by mouth daily.  ? gabapentin (NEURONTIN) 300 MG capsule Take 2 capsules (600 mg total) by mouth 4 (four) times daily.  ? glimepiride (AMARYL) 2 MG tablet Take 2 mg by mouth daily before breakfast.  ? lidocaine (LIDODERM) 5 % Place 1 patch onto the skin daily. Remove & Discard patch within 12 hours or as directed by MD  ? meloxicam (MOBIC) 7.5 MG tablet Take 1 tablet (7.5 mg total) by mouth daily.  ? metFORMIN (GLUCOPHAGE) 500 MG tablet Take 1,000 mg by mouth 2 (two) times daily with a meal.  ? topiramate (TOPAMAX) 25 MG tablet Take 1 tablet (25 mg total) by mouth 2 (two) times daily.  ? ?No current facility-administered medications for this visit. (Other)  ? ? ? ? ?REVIEW OF SYSTEMS: ?ROS   ?Negative for: Constitutional, Gastrointestinal, Neurological, Skin, Genitourinary, Musculoskeletal, HENT, Endocrine, Cardiovascular, Eyes,  Respiratory, Psychiatric, Allergic/Imm, Heme/Lymph ?Last edited by Silvestre Moment on 07/19/2021 11:09 AM.  ?  ? ? ? ?ALLERGIES ?Allergies  ?Allergen Reactions  ? Cymbalta [Duloxetine Hcl] Nausea And Vomiting  ? Duloxetine Nausea Only  ? Hydrocodone Other (See Comments)  ?  Makes the veins "pop" out on her legs.   ? Naproxen Other (See Comments)  ? Novocain [Procaine]   ? Other Other (See Comments)  ?  Mold - headaches   ? Peanut Oil   ?  Other reaction(s): rash  ? Shellfish Allergy Swelling  ? Strawberry (Diagnostic) Other (See Comments)  ? Strawberry Extract Swelling  ?  Strawberry with shrimp made lips swollen  ? Topiramate   ?  Other reaction(s): belly pain  ? Tylenol [Acetaminophen] Nausea Only  ? Yellow Dye Hives  ? Yellow Dyes (Non-Tartrazine) Hives  ? ? ?PAST MEDICAL HISTORY ?Past Medical History:  ?Diagnosis Date  ? Asthma   ? Complication of anesthesia   ? can not have novacain  ? Cystoid macular edema of left eye 12/22/2019  ? Diabetes mellitus   ? A1C < 7  ? Headache   ? Tachycardia   ? no problems now.  ? Vision abnormalities   ? hole in left eye  ? ?Past Surgical History:  ?Procedure Laterality Date  ? BREAST LUMPECTOMY    ? FOOT SURGERY    ? HAND SURGERY    ? TUBAL LIGATION    ?  uterine cyst    ? ? ?FAMILY HISTORY ?Family History  ?Problem Relation Age of Onset  ? Heart failure Father   ? Lymphoma Brother   ? Stroke Neg Hx   ? ? ?SOCIAL HISTORY ?Social History  ? ?Tobacco Use  ? Smoking status: Never  ? Smokeless tobacco: Never  ?Substance Use Topics  ? Alcohol use: Yes  ?  Comment: once a year  ? Drug use: No  ? ?  ? ?  ? ?OPHTHALMIC EXAM: ? ?Base Eye Exam   ? ? Visual Acuity (ETDRS)   ? ?   Right Left  ? Dist [place] 20/40 20/30 -2  ? Dist ph  20/25 -2   ? ?  ?  ? ? Tonometry (Tonopen, 11:17 AM)   ? ?   Right Left  ? Pressure 18 15  ? ?  ?  ? ? Pupils   ? ?   Pupils APD  ? Right PERRL None  ? Left PERRL None  ? ?  ?  ? ? Visual Fields   ? ?   Left Right  ?  Full Full  ? ?  ?  ? ? Extraocular Movement   ? ?    Right Left  ?  Full Full  ? ?  ?  ? ? Neuro/Psych   ? ? Oriented x3: Yes  ? Mood/Affect: Normal  ? ?  ?  ? ? Dilation   ? ? Both eyes: 1.0% Mydriacyl, 2.5% Phenylephrine @ 11:17 AM  ? ?  ?  ? ?  ? ?Slit Lamp and Fundus Exam   ? ? External Exam   ? ?   Right Left  ? External Normal Normal  ? ?  ?  ? ? Slit Lamp Exam   ? ?   Right Left  ? Lids/Lashes Normal Normal  ? Conjunctiva/Sclera White and quiet White and quiet  ? Cornea Clear Clear  ? Anterior Chamber Deep and quiet Deep and quiet  ? Iris Round and reactive Round and reactive  ? Lens 2+ Nuclear sclerosis Centered posterior chamber intraocular lens  ? Anterior Vitreous Normal Normal  ? ?  ?  ? ? Fundus Exam   ? ?   Right Left  ? Posterior Vitreous Normal Normal  ? Disc Normal Normal  ? C/D Ratio 0.4 0.4  ? Macula Intermediate age related macular degeneration, Hard drusen, no hemorrhage, no macular thickening, no disciform scar Normal  ? Vessels NPDR- Moderate NPDR- Moderate  ? 3 Normal old scar, old scar inferiorly Normal  ? ?  ?  ? ?  ? ? ?IMAGING AND PROCEDURES  ?Imaging and Procedures for 07/19/21 ? ?OCT, Retina - OU - Both Eyes   ? ?   ?Right Eye ?Quality was good. Scan locations included subfoveal. Central Foveal Thickness: 285. Progression has been stable. Findings include no IRF, abnormal foveal contour, no SRF, retinal drusen .  ? ?Left Eye ?Quality was good. Scan locations included subfoveal. Central Foveal Thickness: 328. Findings include abnormal foveal contour, retinal drusen , no SRF, no IRF.  ? ?Notes ?OD stable over time ? ?OS Status post macular hole repair 2016 doing well ? ?  ? ?Color Fundus Photography Optos - OU - Both Eyes   ? ?   ?Right Eye ?Progression has no prior data. Disc findings include normal observations. Macula : normal observations. Periphery : RPE abnormality.  ? ?Left Eye ?Progression has no prior data. Disc findings include normal observations.  Macula : normal observations.  ? ?Notes ?OD, moderate NPDR visible. ? ?OS  with moderate to borderline severe NPDR in 2 quadrants will need to monitor this closely OS. ? ? ? ?  ? ? ?  ?  ? ?  ?ASSESSMENT/PLAN: ? ?Lamellar macular hole of left eye ?Looks great post vitrectomy membrane peel repair macular hole 2016 ? ?Nuclear sclerotic cataract of right eye ?Moderate OD follow-up Dr. Rutherford Guys as scheduled ? ?Moderate nonproliferative diabetic retinopathy of left eye with macular edema associated with type 2 diabetes mellitus ([place]) ?Stable OS, with 2 quadrants of near severe NPDR nasal we will continue to monitor closely ? ?Moderate nonproliferative diabetic retinopathy of right eye ([place]) ?OD moderate no progression  ? ?  ICD-10-CM   ?1. Moderate nonproliferative diabetic retinopathy of left eye with macular edema associated with type 2 diabetes mellitus ([facility])  I95.1884 OCT, Retina - OU - Both Eyes  ?  Color Fundus Photography Optos - OU - Both Eyes  ?  ?2. Lamellar macular hole of left eye  H35.342   ?  ?3. Nuclear sclerotic cataract of right eye  H25.11   ?  ?4. Moderate nonproliferative diabetic retinopathy of right eye associated with type 1 diabetes mellitus, macular edema presence unspecified ([place])  Z66.0630   ?  ? ? ?1.  OU with moderate NPDR overall doing well. ? ?OS with borderline progression to NPDR of severe degree we will monitor this more closely follow-up in 6 months ? ?2.  OD with cataract.  We will need to monitor and follow-up if symptoms develop may need surgery in the future soon ? ?3.  Lengthy review of patient's clinical course since onset of macular hole and its repair from 2016.  Preoperative 20/200 vision in 2016 improved substantially to 20/60 however cataract did progress in that left eye.  I explained to the patient and her cataract surgery in fact did improve her vision initially to 20/40 and that over time this vision has continued to improve today to 20/30 in the left eye. ? ?Explained to the patient that the left eye analyses as good as the right eye ? ?And  that the pinhole acuity in the right eye is due to cataract ? ?[place] OCT images in chart from the previous EMR reviewed with the patient ? ?Ophthalmic Meds Ordered this visit:  ?No orders of the defined types

## 2021-07-19 NOTE — Assessment & Plan Note (Addendum)
Stable OS, with 2 quadrants of near severe NPDR nasal we will continue to monitor closely ?

## 2021-07-19 NOTE — Assessment & Plan Note (Signed)
Looks great post vitrectomy membrane peel repair macular hole 2016 ?

## 2021-07-21 DIAGNOSIS — M25551 Pain in right hip: Secondary | ICD-10-CM | POA: Diagnosis not present

## 2021-07-21 DIAGNOSIS — M7989 Other specified soft tissue disorders: Secondary | ICD-10-CM | POA: Diagnosis not present

## 2021-07-21 DIAGNOSIS — M7918 Myalgia, other site: Secondary | ICD-10-CM | POA: Diagnosis not present

## 2021-07-21 DIAGNOSIS — M5416 Radiculopathy, lumbar region: Secondary | ICD-10-CM | POA: Diagnosis not present

## 2021-07-25 ENCOUNTER — Other Ambulatory Visit (HOSPITAL_BASED_OUTPATIENT_CLINIC_OR_DEPARTMENT_OTHER): Payer: Self-pay

## 2021-07-26 ENCOUNTER — Other Ambulatory Visit (HOSPITAL_BASED_OUTPATIENT_CLINIC_OR_DEPARTMENT_OTHER): Payer: Self-pay

## 2021-07-27 ENCOUNTER — Other Ambulatory Visit (HOSPITAL_BASED_OUTPATIENT_CLINIC_OR_DEPARTMENT_OTHER): Payer: Self-pay

## 2021-07-27 DIAGNOSIS — M5416 Radiculopathy, lumbar region: Secondary | ICD-10-CM | POA: Diagnosis not present

## 2021-07-27 MED ORDER — DIAZEPAM 5 MG PO TABS
ORAL_TABLET | ORAL | 0 refills | Status: DC
  Filled 2021-07-27: qty 2, 1d supply, fill #0

## 2021-07-28 ENCOUNTER — Other Ambulatory Visit (HOSPITAL_BASED_OUTPATIENT_CLINIC_OR_DEPARTMENT_OTHER): Payer: Self-pay

## 2021-08-01 DIAGNOSIS — M5416 Radiculopathy, lumbar region: Secondary | ICD-10-CM | POA: Diagnosis not present

## 2021-08-04 ENCOUNTER — Other Ambulatory Visit (HOSPITAL_BASED_OUTPATIENT_CLINIC_OR_DEPARTMENT_OTHER): Payer: Self-pay

## 2021-08-04 ENCOUNTER — Ambulatory Visit: Payer: Medicare HMO | Admitting: Neurology

## 2021-08-17 ENCOUNTER — Other Ambulatory Visit (HOSPITAL_BASED_OUTPATIENT_CLINIC_OR_DEPARTMENT_OTHER): Payer: Self-pay

## 2021-08-17 DIAGNOSIS — M5416 Radiculopathy, lumbar region: Secondary | ICD-10-CM | POA: Diagnosis not present

## 2021-08-17 MED ORDER — DIAZEPAM 5 MG PO TABS
ORAL_TABLET | ORAL | 0 refills | Status: AC
  Filled 2021-08-17: qty 2, 2d supply, fill #0

## 2021-08-30 DIAGNOSIS — M5416 Radiculopathy, lumbar region: Secondary | ICD-10-CM | POA: Diagnosis not present

## 2021-09-01 ENCOUNTER — Other Ambulatory Visit (HOSPITAL_BASED_OUTPATIENT_CLINIC_OR_DEPARTMENT_OTHER): Payer: Self-pay

## 2021-09-15 DIAGNOSIS — M5416 Radiculopathy, lumbar region: Secondary | ICD-10-CM | POA: Diagnosis not present

## 2021-10-05 DIAGNOSIS — E1142 Type 2 diabetes mellitus with diabetic polyneuropathy: Secondary | ICD-10-CM | POA: Diagnosis not present

## 2021-10-13 ENCOUNTER — Other Ambulatory Visit: Payer: Self-pay | Admitting: Neurology

## 2021-11-07 ENCOUNTER — Other Ambulatory Visit (HOSPITAL_BASED_OUTPATIENT_CLINIC_OR_DEPARTMENT_OTHER): Payer: Self-pay

## 2021-12-07 ENCOUNTER — Other Ambulatory Visit (HOSPITAL_BASED_OUTPATIENT_CLINIC_OR_DEPARTMENT_OTHER): Payer: Self-pay

## 2021-12-14 DIAGNOSIS — Z1231 Encounter for screening mammogram for malignant neoplasm of breast: Secondary | ICD-10-CM | POA: Diagnosis not present

## 2021-12-23 ENCOUNTER — Other Ambulatory Visit: Payer: Self-pay

## 2021-12-23 ENCOUNTER — Emergency Department (HOSPITAL_BASED_OUTPATIENT_CLINIC_OR_DEPARTMENT_OTHER)
Admission: EM | Admit: 2021-12-23 | Discharge: 2021-12-23 | Disposition: A | Discharge status: Home / Self Care | Payer: Medicare HMO | Attending: Emergency Medicine | Admitting: Emergency Medicine

## 2021-12-23 ENCOUNTER — Encounter (HOSPITAL_BASED_OUTPATIENT_CLINIC_OR_DEPARTMENT_OTHER): Payer: Self-pay | Admitting: Emergency Medicine

## 2021-12-23 DIAGNOSIS — W5501XD Bitten by cat, subsequent encounter: Secondary | ICD-10-CM | POA: Insufficient documentation

## 2021-12-23 DIAGNOSIS — S51832D Puncture wound without foreign body of left forearm, subsequent encounter: Secondary | ICD-10-CM | POA: Insufficient documentation

## 2021-12-23 DIAGNOSIS — Z2914 Encounter for prophylactic rabies immune globin: Secondary | ICD-10-CM | POA: Insufficient documentation

## 2021-12-23 DIAGNOSIS — Z203 Contact with and (suspected) exposure to rabies: Secondary | ICD-10-CM | POA: Diagnosis not present

## 2021-12-23 DIAGNOSIS — Z7902 Long term (current) use of antithrombotics/antiplatelets: Secondary | ICD-10-CM | POA: Diagnosis not present

## 2021-12-23 DIAGNOSIS — Z91010 Allergy to peanuts: Secondary | ICD-10-CM | POA: Diagnosis not present

## 2021-12-23 DIAGNOSIS — Z23 Encounter for immunization: Secondary | ICD-10-CM | POA: Diagnosis not present

## 2021-12-23 DIAGNOSIS — W5501XA Bitten by cat, initial encounter: Secondary | ICD-10-CM

## 2021-12-23 DIAGNOSIS — S51852A Open bite of left forearm, initial encounter: Secondary | ICD-10-CM | POA: Diagnosis not present

## 2021-12-23 MED ORDER — RABIES IMMUNE GLOBULIN 150 UNIT/ML IM INJ
20.0000 [IU]/kg | INJECTION | Freq: Once | INTRAMUSCULAR | Status: AC
  Administered 2021-12-23: 1350 [IU]
  Filled 2021-12-23: qty 10

## 2021-12-23 MED ORDER — RABIES VACCINE, PCEC IM SUSR
1.0000 mL | Freq: Once | INTRAMUSCULAR | Status: AC
Start: 2021-12-23 — End: 2021-12-23
  Administered 2021-12-23: 1 mL via INTRAMUSCULAR
  Filled 2021-12-23: qty 1

## 2021-12-23 NOTE — Discharge Instructions (Signed)
                                  RABIES VACCINE FOLLOW UP  Patient's Name: Anna Norris                     Original Order Date:12/23/2021  Medical Record Number: [id number]  ED Physician: Deno Etienne, DO Primary Diagnosis: Rabies Exposure       PCP: Lawerance Cruel, MD  Patient Phone Number: (home) [phone] (home)    (cell)  Telephone Information:  Mobile [phone]    (work) There is no work phone number on file. Species of Animal:     You have been seen in the Emergency Department for a possible rabies exposure. It's very important you return for the additional vaccine doses.  Please call the clinic listed below for hours of operation.   Clinic that will administer your rabies vaccines:    DAY 0:  12/23/2021      DAY 3:  12/26/2021       DAY 7:  12/30/2021     DAY 14:  01/06/2022           Your Vaccine Injections will be given at the Urgent [facility]) at [place] [facility] is open from 8:00AM-9:00PM Monday thru Friday and 10:00AM-9:00PM on Saturdays and Sundays.  Please call [phone] [facility] [place], if you have any problems with making your scheduled appointments.  This will assure you prompt attention on your arrival and allow [place] to be prepared for your return visit.  There will be a minimal fee for the injection that will be billed to your insurance company along with the charge for the vaccine.

## 2021-12-23 NOTE — ED Provider Notes (Signed)
[facility] Provider Note   CSN: [id number] Arrival date & time: 12/23/21  0846     History  Chief Complaint  Patient presents with   Animal Bite    Anna Norris is a 81 y.o. female.  81 yo F with a chief complaint of a cat bite.  This actually happened almost a week and a half ago.  She had adopted a cat that was born feral.  She got bit twice and then had brought the cat to the vet for the first time where they were concerned that the cat might have rabies and euthanized the cats in her shipping it to [facility] for autopsy.  They recommended that the patient come here for rabies vaccination.  She denies any significant redness drainage or fevers.   Animal Bite      Home Medications Prior to Admission medications   Medication Sig Start Date End Date Taking? Authorizing Provider  clopidogrel (PLAVIX) 75 MG tablet Take 1 tablet (75 mg total) by mouth daily. 12/12/16   Garvin Fila, MD  diazepam (VALIUM) 5 MG tablet Take 1 tablet by mouth as directed Take 1 tablet 1 hour prior to procedure, may repeat after 45 minutes if not effective.  No driving for 4 hours after 08/17/21     gabapentin (NEURONTIN) 300 MG capsule Take 2 capsules (600 mg total) by mouth 4 (four) times daily. 04/04/21   Marcial Pacas, MD  glimepiride (AMARYL) 2 MG tablet Take 2 mg by mouth daily before breakfast.    [provider]  lidocaine (LIDODERM) 5 % Place 1 patch onto the skin daily. Remove & Discard patch within 12 hours or as directed by MD 05/28/19   Joy, Shawn C, PA-C  meloxicam (MOBIC) 7.5 MG tablet Take 1 tablet (7.5 mg total) by mouth daily. 06/20/21   Rosemarie Ax, MD  metFORMIN (GLUCOPHAGE) 500 MG tablet Take 1,000 mg by mouth 2 (two) times daily with a meal.    [provider]  topiramate (TOPAMAX) 25 MG tablet Take 1 tablet (25 mg total) by mouth 2 (two) times daily. 07/06/21   Garvin Fila, MD      Allergies    Cymbalta [duloxetine hcl],  Duloxetine, Hydrocodone, Naproxen, Novocain [procaine], Other, Peanut oil, Shellfish allergy, Strawberry (diagnostic), Strawberry extract, Topiramate, Tylenol [acetaminophen], Yellow dye, and Yellow dyes (non-tartrazine)    Review of Systems   Review of Systems  Physical Exam Updated Vital Signs BP (!) 134/102 (BP Location: Right Arm)   Pulse 76   Temp 98.6 F (37 C)   Resp 19   Wt 68.9 kg   SpO2 95%   BMI 24.52 kg/m  Physical Exam Vitals and nursing note reviewed.  Constitutional:      General: She is not in acute distress.    Appearance: She is well-developed. She is not diaphoretic.  HENT:     Head: Normocephalic and atraumatic.  Eyes:     Pupils: Pupils are equal, round, and reactive to light.  Cardiovascular:     Rate and Rhythm: Normal rate and regular rhythm.     Heart sounds: No murmur heard.    No friction rub. No gallop.  Pulmonary:     Effort: Pulmonary effort is normal.     Breath sounds: No wheezing or rales.  Abdominal:     General: There is no distension.     Palpations: Abdomen is soft.     Tenderness: There is no abdominal tenderness.  Musculoskeletal:        General: No tenderness.     Cervical back: Normal range of motion and neck supple.  Skin:    General: Skin is warm and dry.     Comments: 44 old puncture wounds to the left forearm.  Mild bruising surrounding.  No obvious warmth no drainage or fluctuance.  Neurological:     Mental Status: She is alert and oriented to person, place, and time.  Psychiatric:        Behavior: Behavior normal.     ED Results / Procedures / Treatments   Labs (all labs ordered are listed, but only abnormal results are displayed) Labs Reviewed - No data to display  EKG None  Radiology No results found.  Procedures Procedures    Medications Ordered in ED Medications  rabies immune globulin (HYPERRAB/KEDRAB) injection 1,350 Units (1,350 Units Infiltration Given 12/23/21 1016)  rabies vaccine (RABAVERT)  injection 1 mL (1 mL Intramuscular Given 12/23/21 1009)    ED Course/ Medical Decision Making/ A&P                           Medical Decision Making Risk Prescription drug management.   81 yo F with a chief complaint of needing the rabies vaccination.  The patient was bit by a cat that she had adopted.  There was some concern by the vet when she brought the cat there that they may have rabies and the cat was euthanized and then it is currently pending autopsy.  I had a long discussion with the patient and family about risks and benefits of moving forward.  12:02 PM:  I have discussed the diagnosis/risks/treatment options with the patient and family.  Evaluation and diagnostic testing in the emergency department does not suggest an emergent condition requiring admission or immediate intervention beyond what has been performed at this time.  They will follow up with PCP. We also discussed returning to the ED immediately if new or worsening sx occur. We discussed the sx which are most concerning (e.g., sudden worsening pain, fever, inability to tolerate by mouth) that necessitate immediate return. Medications administered to the patient during their visit and any new prescriptions provided to the patient are listed below.  Medications given during this visit Medications  rabies immune globulin (HYPERRAB/KEDRAB) injection 1,350 Units (1,350 Units Infiltration Given 12/23/21 1016)  rabies vaccine (RABAVERT) injection 1 mL (1 mL Intramuscular Given 12/23/21 1009)     The patient appears reasonably screen and/or stabilized for discharge and I doubt any other medical condition or other [facility] requiring further screening, evaluation, or treatment in the ED at this time prior to discharge.          Final Clinical Impression(s) / ED Diagnoses Final diagnoses:  Cat bite, initial encounter    Rx / DC Orders ED Discharge Orders     None         Deno Etienne, DO 12/23/21 1202

## 2021-12-23 NOTE — ED Triage Notes (Signed)
Bitten by a kitten on left forearm last Sunday  , Kitten was  9 weeks ,she has had it since 43 weeks old , area red but no s/s of infection , no drainage or fever, vet told her to come to get checked for  rabies

## 2021-12-27 ENCOUNTER — Ambulatory Visit (HOSPITAL_COMMUNITY)
Admission: EM | Admit: 2021-12-27 | Discharge: 2021-12-27 | Disposition: A | Discharge status: Home / Self Care | Payer: Medicare HMO | Attending: Internal Medicine | Admitting: Internal Medicine

## 2021-12-27 DIAGNOSIS — Z203 Contact with and (suspected) exposure to rabies: Secondary | ICD-10-CM | POA: Diagnosis not present

## 2021-12-27 DIAGNOSIS — Z23 Encounter for immunization: Secondary | ICD-10-CM

## 2021-12-27 MED ORDER — RABIES VACCINE, PCEC IM SUSR
INTRAMUSCULAR | Status: AC
  Filled 2021-12-27: qty 1

## 2021-12-27 MED ORDER — RABIES VACCINE, PCEC IM SUSR
1.0000 mL | Freq: Once | INTRAMUSCULAR | Status: AC
  Administered 2021-12-27: 1 mL via INTRAMUSCULAR

## 2021-12-30 ENCOUNTER — Ambulatory Visit (HOSPITAL_COMMUNITY): Payer: Medicare HMO

## 2021-12-30 NOTE — ED Notes (Signed)
Patient presenting for rabies vaccine. Given in the right deltoid. Patient tolerated well.

## 2022-01-06 ENCOUNTER — Ambulatory Visit (HOSPITAL_COMMUNITY): Payer: Medicare HMO

## 2022-01-12 DIAGNOSIS — M5416 Radiculopathy, lumbar region: Secondary | ICD-10-CM | POA: Diagnosis not present

## 2022-01-19 ENCOUNTER — Encounter (INDEPENDENT_AMBULATORY_CARE_PROVIDER_SITE_OTHER): Payer: Medicare HMO | Admitting: Ophthalmology

## 2022-01-25 ENCOUNTER — Other Ambulatory Visit: Payer: Self-pay

## 2022-02-02 ENCOUNTER — Other Ambulatory Visit (HOSPITAL_BASED_OUTPATIENT_CLINIC_OR_DEPARTMENT_OTHER): Payer: Self-pay

## 2022-02-16 DIAGNOSIS — M5416 Radiculopathy, lumbar region: Secondary | ICD-10-CM | POA: Diagnosis not present

## 2022-02-20 ENCOUNTER — Other Ambulatory Visit: Payer: Self-pay | Admitting: Neurology

## 2022-02-20 ENCOUNTER — Other Ambulatory Visit (HOSPITAL_BASED_OUTPATIENT_CLINIC_OR_DEPARTMENT_OTHER): Payer: Self-pay

## 2022-02-20 MED ORDER — GABAPENTIN 300 MG PO CAPS
600.0000 mg | ORAL_CAPSULE | Freq: Four times a day (QID) | ORAL | 6 refills | Status: AC
Start: 2022-02-20 — End: ?
  Filled 2022-02-20: qty 240, 30d supply, fill #0
  Filled 2022-03-23: qty 240, 30d supply, fill #1
  Filled 2022-05-18: qty 240, 30d supply, fill #2
  Filled 2022-06-12: qty 240, 30d supply, fill #3

## 2022-02-21 DIAGNOSIS — E113392 Type 2 diabetes mellitus with moderate nonproliferative diabetic retinopathy without macular edema, left eye: Secondary | ICD-10-CM | POA: Diagnosis not present

## 2022-02-21 DIAGNOSIS — H353131 Nonexudative age-related macular degeneration, bilateral, early dry stage: Secondary | ICD-10-CM | POA: Diagnosis not present

## 2022-02-21 DIAGNOSIS — H35342 Macular cyst, hole, or pseudohole, left eye: Secondary | ICD-10-CM | POA: Diagnosis not present

## 2022-02-21 DIAGNOSIS — H2511 Age-related nuclear cataract, right eye: Secondary | ICD-10-CM | POA: Diagnosis not present

## 2022-03-03 DIAGNOSIS — M5416 Radiculopathy, lumbar region: Secondary | ICD-10-CM | POA: Diagnosis not present

## 2022-03-30 ENCOUNTER — Other Ambulatory Visit: Payer: Self-pay | Admitting: Neurology

## 2022-03-30 DIAGNOSIS — M5431 Sciatica, right side: Secondary | ICD-10-CM

## 2022-04-13 DIAGNOSIS — M5416 Radiculopathy, lumbar region: Secondary | ICD-10-CM | POA: Diagnosis not present

## 2022-04-13 DIAGNOSIS — M25551 Pain in right hip: Secondary | ICD-10-CM | POA: Diagnosis not present

## 2022-04-25 DIAGNOSIS — M5416 Radiculopathy, lumbar region: Secondary | ICD-10-CM | POA: Diagnosis not present

## 2022-04-25 DIAGNOSIS — M25551 Pain in right hip: Secondary | ICD-10-CM | POA: Diagnosis not present

## 2022-05-16 ENCOUNTER — Other Ambulatory Visit: Payer: Self-pay | Admitting: Family Medicine

## 2022-05-16 DIAGNOSIS — M5416 Radiculopathy, lumbar region: Secondary | ICD-10-CM

## 2022-06-03 ENCOUNTER — Ambulatory Visit
Admission: RE | Admit: 2022-06-03 | Discharge: 2022-06-03 | Disposition: A | Discharge status: Home / Self Care | Payer: Medicare HMO | Source: Ambulatory Visit | Attending: Family Medicine | Admitting: Family Medicine

## 2022-06-03 DIAGNOSIS — M5416 Radiculopathy, lumbar region: Secondary | ICD-10-CM

## 2022-06-03 DIAGNOSIS — M48061 Spinal stenosis, lumbar region without neurogenic claudication: Secondary | ICD-10-CM | POA: Diagnosis not present

## 2022-06-06 DIAGNOSIS — M5416 Radiculopathy, lumbar region: Secondary | ICD-10-CM | POA: Diagnosis not present

## 2022-07-03 ENCOUNTER — Encounter: Payer: Self-pay | Admitting: *Deleted

## 2022-07-21 ENCOUNTER — Other Ambulatory Visit (HOSPITAL_BASED_OUTPATIENT_CLINIC_OR_DEPARTMENT_OTHER): Payer: Self-pay

## 2022-07-21 DIAGNOSIS — E1142 Type 2 diabetes mellitus with diabetic polyneuropathy: Secondary | ICD-10-CM | POA: Diagnosis not present

## 2022-07-21 DIAGNOSIS — E7800 Pure hypercholesterolemia, unspecified: Secondary | ICD-10-CM | POA: Diagnosis not present

## 2022-07-21 DIAGNOSIS — I779 Disorder of arteries and arterioles, unspecified: Secondary | ICD-10-CM | POA: Diagnosis not present

## 2022-07-21 DIAGNOSIS — I10 Essential (primary) hypertension: Secondary | ICD-10-CM | POA: Diagnosis not present

## 2022-07-21 DIAGNOSIS — Z0000 Encounter for general adult medical examination without abnormal findings: Secondary | ICD-10-CM | POA: Diagnosis not present

## 2022-07-21 DIAGNOSIS — M8588 Other specified disorders of bone density and structure, other site: Secondary | ICD-10-CM | POA: Diagnosis not present

## 2022-07-21 DIAGNOSIS — Z9181 History of falling: Secondary | ICD-10-CM | POA: Diagnosis not present

## 2022-07-21 DIAGNOSIS — Z6826 Body mass index (BMI) 26.0-26.9, adult: Secondary | ICD-10-CM | POA: Diagnosis not present

## 2022-07-21 MED ORDER — GABAPENTIN 600 MG PO TABS
600.0000 mg | ORAL_TABLET | Freq: Four times a day (QID) | ORAL | 4 refills | Status: DC
  Filled 2022-07-21: qty 360, 90d supply, fill #0
  Filled 2022-10-24: qty 360, 90d supply, fill #1
  Filled 2023-01-22: qty 360, 90d supply, fill #2
  Filled 2023-04-18: qty 360, 90d supply, fill #3
  Filled 2023-07-11: qty 360, 90d supply, fill #4

## 2022-08-17 ENCOUNTER — Other Ambulatory Visit: Payer: Self-pay | Admitting: Neurology

## 2022-08-18 ENCOUNTER — Other Ambulatory Visit (HOSPITAL_BASED_OUTPATIENT_CLINIC_OR_DEPARTMENT_OTHER): Payer: Self-pay

## 2022-08-18 ENCOUNTER — Other Ambulatory Visit: Payer: Self-pay | Admitting: Neurology

## 2022-08-22 ENCOUNTER — Other Ambulatory Visit (HOSPITAL_BASED_OUTPATIENT_CLINIC_OR_DEPARTMENT_OTHER): Payer: Self-pay

## 2022-08-22 DIAGNOSIS — H35342 Macular cyst, hole, or pseudohole, left eye: Secondary | ICD-10-CM | POA: Diagnosis not present

## 2022-08-22 DIAGNOSIS — H2511 Age-related nuclear cataract, right eye: Secondary | ICD-10-CM | POA: Diagnosis not present

## 2022-08-22 DIAGNOSIS — E113392 Type 2 diabetes mellitus with moderate nonproliferative diabetic retinopathy without macular edema, left eye: Secondary | ICD-10-CM | POA: Diagnosis not present

## 2022-08-22 DIAGNOSIS — H353131 Nonexudative age-related macular degeneration, bilateral, early dry stage: Secondary | ICD-10-CM | POA: Diagnosis not present

## 2022-10-10 DIAGNOSIS — D485 Neoplasm of uncertain behavior of skin: Secondary | ICD-10-CM | POA: Diagnosis not present

## 2022-10-10 DIAGNOSIS — Z0189 Encounter for other specified special examinations: Secondary | ICD-10-CM | POA: Diagnosis not present

## 2022-10-10 DIAGNOSIS — L729 Follicular cyst of the skin and subcutaneous tissue, unspecified: Secondary | ICD-10-CM | POA: Diagnosis not present

## 2022-11-09 ENCOUNTER — Other Ambulatory Visit (HOSPITAL_BASED_OUTPATIENT_CLINIC_OR_DEPARTMENT_OTHER): Payer: Self-pay

## 2022-11-09 DIAGNOSIS — L089 Local infection of the skin and subcutaneous tissue, unspecified: Secondary | ICD-10-CM | POA: Diagnosis not present

## 2022-11-09 MED ORDER — MUPIROCIN 2 % EX OINT
1.0000 | TOPICAL_OINTMENT | Freq: Three times a day (TID) | CUTANEOUS | 0 refills | Status: AC
  Filled 2022-11-09: qty 22, 8d supply, fill #0

## 2022-11-15 ENCOUNTER — Other Ambulatory Visit (HOSPITAL_BASED_OUTPATIENT_CLINIC_OR_DEPARTMENT_OTHER): Payer: Self-pay

## 2022-11-15 MED ORDER — DOXYCYCLINE HYCLATE 100 MG PO CAPS
100.0000 mg | ORAL_CAPSULE | Freq: Two times a day (BID) | ORAL | 0 refills | Status: AC
  Filled 2022-11-15: qty 20, 10d supply, fill #0

## 2023-01-04 DIAGNOSIS — Z1231 Encounter for screening mammogram for malignant neoplasm of breast: Secondary | ICD-10-CM | POA: Diagnosis not present

## 2023-01-21 DIAGNOSIS — E119 Type 2 diabetes mellitus without complications: Secondary | ICD-10-CM | POA: Diagnosis not present

## 2023-01-21 DIAGNOSIS — W268XXA Contact with other sharp object(s), not elsewhere classified, initial encounter: Secondary | ICD-10-CM | POA: Diagnosis not present

## 2023-01-21 DIAGNOSIS — S61421A Laceration with foreign body of right hand, initial encounter: Secondary | ICD-10-CM | POA: Diagnosis not present

## 2023-01-23 DIAGNOSIS — S60551A Superficial foreign body of right hand, initial encounter: Secondary | ICD-10-CM | POA: Diagnosis not present

## 2023-01-25 DIAGNOSIS — Z9989 Dependence on other enabling machines and devices: Secondary | ICD-10-CM | POA: Diagnosis not present

## 2023-01-25 DIAGNOSIS — R1030 Lower abdominal pain, unspecified: Secondary | ICD-10-CM | POA: Diagnosis not present

## 2023-01-25 DIAGNOSIS — I10 Essential (primary) hypertension: Secondary | ICD-10-CM | POA: Diagnosis not present

## 2023-01-25 DIAGNOSIS — E113391 Type 2 diabetes mellitus with moderate nonproliferative diabetic retinopathy without macular edema, right eye: Secondary | ICD-10-CM | POA: Diagnosis not present

## 2023-01-25 DIAGNOSIS — E1142 Type 2 diabetes mellitus with diabetic polyneuropathy: Secondary | ICD-10-CM | POA: Diagnosis not present

## 2023-01-25 DIAGNOSIS — Z6826 Body mass index (BMI) 26.0-26.9, adult: Secondary | ICD-10-CM | POA: Diagnosis not present

## 2023-01-25 DIAGNOSIS — M5430 Sciatica, unspecified side: Secondary | ICD-10-CM | POA: Diagnosis not present

## 2023-01-29 DIAGNOSIS — Z4802 Encounter for removal of sutures: Secondary | ICD-10-CM | POA: Diagnosis not present

## 2023-01-30 DIAGNOSIS — D485 Neoplasm of uncertain behavior of skin: Secondary | ICD-10-CM | POA: Diagnosis not present

## 2023-01-30 DIAGNOSIS — R238 Other skin changes: Secondary | ICD-10-CM | POA: Diagnosis not present

## 2023-02-19 DIAGNOSIS — H353131 Nonexudative age-related macular degeneration, bilateral, early dry stage: Secondary | ICD-10-CM | POA: Diagnosis not present

## 2023-02-19 DIAGNOSIS — H2511 Age-related nuclear cataract, right eye: Secondary | ICD-10-CM | POA: Diagnosis not present

## 2023-02-19 DIAGNOSIS — E113391 Type 2 diabetes mellitus with moderate nonproliferative diabetic retinopathy without macular edema, right eye: Secondary | ICD-10-CM | POA: Diagnosis not present

## 2023-02-19 DIAGNOSIS — E113492 Type 2 diabetes mellitus with severe nonproliferative diabetic retinopathy without macular edema, left eye: Secondary | ICD-10-CM | POA: Diagnosis not present

## 2023-02-19 DIAGNOSIS — H35342 Macular cyst, hole, or pseudohole, left eye: Secondary | ICD-10-CM | POA: Diagnosis not present

## 2023-02-28 DIAGNOSIS — E113492 Type 2 diabetes mellitus with severe nonproliferative diabetic retinopathy without macular edema, left eye: Secondary | ICD-10-CM | POA: Diagnosis not present

## 2023-04-12 DIAGNOSIS — E113492 Type 2 diabetes mellitus with severe nonproliferative diabetic retinopathy without macular edema, left eye: Secondary | ICD-10-CM | POA: Diagnosis not present

## 2023-04-12 DIAGNOSIS — E113491 Type 2 diabetes mellitus with severe nonproliferative diabetic retinopathy without macular edema, right eye: Secondary | ICD-10-CM | POA: Diagnosis not present

## 2023-04-23 ENCOUNTER — Other Ambulatory Visit (HOSPITAL_BASED_OUTPATIENT_CLINIC_OR_DEPARTMENT_OTHER): Payer: Self-pay

## 2023-04-24 ENCOUNTER — Other Ambulatory Visit (HOSPITAL_BASED_OUTPATIENT_CLINIC_OR_DEPARTMENT_OTHER): Payer: Self-pay

## 2023-04-24 DIAGNOSIS — R102 Pelvic and perineal pain: Secondary | ICD-10-CM | POA: Diagnosis not present

## 2023-04-24 DIAGNOSIS — R3982 Chronic bladder pain: Secondary | ICD-10-CM | POA: Diagnosis not present

## 2023-04-24 MED ORDER — DIAZEPAM 10 MG PO TABS
10.0000 mg | ORAL_TABLET | Freq: Every evening | ORAL | 2 refills | Status: AC
  Filled 2023-04-24: qty 30, 30d supply, fill #0
  Filled 2023-05-28 (×2): qty 30, 30d supply, fill #1
  Filled 2023-06-28: qty 30, 30d supply, fill #2

## 2023-05-28 ENCOUNTER — Other Ambulatory Visit: Payer: Self-pay

## 2023-05-28 ENCOUNTER — Other Ambulatory Visit (HOSPITAL_BASED_OUTPATIENT_CLINIC_OR_DEPARTMENT_OTHER): Payer: Self-pay

## 2023-05-29 ENCOUNTER — Other Ambulatory Visit (HOSPITAL_BASED_OUTPATIENT_CLINIC_OR_DEPARTMENT_OTHER): Payer: Self-pay

## 2023-05-30 ENCOUNTER — Other Ambulatory Visit (HOSPITAL_BASED_OUTPATIENT_CLINIC_OR_DEPARTMENT_OTHER): Payer: Self-pay

## 2023-06-28 ENCOUNTER — Other Ambulatory Visit: Payer: Self-pay

## 2023-07-12 DIAGNOSIS — H35342 Macular cyst, hole, or pseudohole, left eye: Secondary | ICD-10-CM | POA: Diagnosis not present

## 2023-07-12 DIAGNOSIS — H353131 Nonexudative age-related macular degeneration, bilateral, early dry stage: Secondary | ICD-10-CM | POA: Diagnosis not present

## 2023-07-12 DIAGNOSIS — E113493 Type 2 diabetes mellitus with severe nonproliferative diabetic retinopathy without macular edema, bilateral: Secondary | ICD-10-CM | POA: Diagnosis not present

## 2023-07-12 DIAGNOSIS — H2511 Age-related nuclear cataract, right eye: Secondary | ICD-10-CM | POA: Diagnosis not present

## 2023-07-26 ENCOUNTER — Other Ambulatory Visit (HOSPITAL_BASED_OUTPATIENT_CLINIC_OR_DEPARTMENT_OTHER): Payer: Self-pay

## 2023-07-26 DIAGNOSIS — J4520 Mild intermittent asthma, uncomplicated: Secondary | ICD-10-CM | POA: Diagnosis not present

## 2023-07-26 DIAGNOSIS — Z0000 Encounter for general adult medical examination without abnormal findings: Secondary | ICD-10-CM | POA: Diagnosis not present

## 2023-07-26 DIAGNOSIS — Z79899 Other long term (current) drug therapy: Secondary | ICD-10-CM | POA: Diagnosis not present

## 2023-07-26 DIAGNOSIS — I779 Disorder of arteries and arterioles, unspecified: Secondary | ICD-10-CM | POA: Diagnosis not present

## 2023-07-26 DIAGNOSIS — M5430 Sciatica, unspecified side: Secondary | ICD-10-CM | POA: Diagnosis not present

## 2023-07-26 DIAGNOSIS — Z6826 Body mass index (BMI) 26.0-26.9, adult: Secondary | ICD-10-CM | POA: Diagnosis not present

## 2023-07-26 DIAGNOSIS — E7800 Pure hypercholesterolemia, unspecified: Secondary | ICD-10-CM | POA: Diagnosis not present

## 2023-07-26 DIAGNOSIS — E1142 Type 2 diabetes mellitus with diabetic polyneuropathy: Secondary | ICD-10-CM | POA: Diagnosis not present

## 2023-07-26 DIAGNOSIS — I10 Essential (primary) hypertension: Secondary | ICD-10-CM | POA: Diagnosis not present

## 2023-07-26 DIAGNOSIS — E113493 Type 2 diabetes mellitus with severe nonproliferative diabetic retinopathy without macular edema, bilateral: Secondary | ICD-10-CM | POA: Diagnosis not present

## 2023-07-26 MED ORDER — GABAPENTIN 600 MG PO TABS
600.0000 mg | ORAL_TABLET | Freq: Four times a day (QID) | ORAL | 4 refills | Status: AC
  Filled 2023-07-26 – 2023-10-03 (×3): qty 360, 90d supply, fill #0
  Filled 2024-01-03: qty 360, 90d supply, fill #1
  Filled 2024-04-07: qty 360, 90d supply, fill #2

## 2023-08-06 DIAGNOSIS — I6523 Occlusion and stenosis of bilateral carotid arteries: Secondary | ICD-10-CM | POA: Diagnosis not present

## 2023-08-06 DIAGNOSIS — I779 Disorder of arteries and arterioles, unspecified: Secondary | ICD-10-CM | POA: Diagnosis not present

## 2023-09-25 DIAGNOSIS — H2511 Age-related nuclear cataract, right eye: Secondary | ICD-10-CM | POA: Diagnosis not present

## 2023-09-25 DIAGNOSIS — H353131 Nonexudative age-related macular degeneration, bilateral, early dry stage: Secondary | ICD-10-CM | POA: Diagnosis not present

## 2023-09-25 DIAGNOSIS — E113493 Type 2 diabetes mellitus with severe nonproliferative diabetic retinopathy without macular edema, bilateral: Secondary | ICD-10-CM | POA: Diagnosis not present

## 2023-09-25 DIAGNOSIS — H35342 Macular cyst, hole, or pseudohole, left eye: Secondary | ICD-10-CM | POA: Diagnosis not present

## 2023-10-02 ENCOUNTER — Other Ambulatory Visit (HOSPITAL_BASED_OUTPATIENT_CLINIC_OR_DEPARTMENT_OTHER): Payer: Self-pay

## 2023-10-02 ENCOUNTER — Other Ambulatory Visit: Payer: Self-pay

## 2023-10-03 ENCOUNTER — Other Ambulatory Visit (HOSPITAL_BASED_OUTPATIENT_CLINIC_OR_DEPARTMENT_OTHER): Payer: Self-pay

## 2023-10-03 DIAGNOSIS — H353131 Nonexudative age-related macular degeneration, bilateral, early dry stage: Secondary | ICD-10-CM | POA: Diagnosis not present

## 2023-10-03 DIAGNOSIS — E113493 Type 2 diabetes mellitus with severe nonproliferative diabetic retinopathy without macular edema, bilateral: Secondary | ICD-10-CM | POA: Diagnosis not present

## 2023-10-30 DIAGNOSIS — I10 Essential (primary) hypertension: Secondary | ICD-10-CM | POA: Diagnosis not present

## 2023-10-30 DIAGNOSIS — E113493 Type 2 diabetes mellitus with severe nonproliferative diabetic retinopathy without macular edema, bilateral: Secondary | ICD-10-CM | POA: Diagnosis not present

## 2023-11-01 DIAGNOSIS — H2511 Age-related nuclear cataract, right eye: Secondary | ICD-10-CM | POA: Diagnosis not present

## 2023-11-01 DIAGNOSIS — E113491 Type 2 diabetes mellitus with severe nonproliferative diabetic retinopathy without macular edema, right eye: Secondary | ICD-10-CM | POA: Diagnosis not present

## 2023-11-01 DIAGNOSIS — E113492 Type 2 diabetes mellitus with severe nonproliferative diabetic retinopathy without macular edema, left eye: Secondary | ICD-10-CM | POA: Diagnosis not present

## 2023-11-01 DIAGNOSIS — H35342 Macular cyst, hole, or pseudohole, left eye: Secondary | ICD-10-CM | POA: Diagnosis not present

## 2023-11-01 DIAGNOSIS — H353131 Nonexudative age-related macular degeneration, bilateral, early dry stage: Secondary | ICD-10-CM | POA: Diagnosis not present

## 2023-12-08 DIAGNOSIS — Z760 Encounter for issue of repeat prescription: Secondary | ICD-10-CM | POA: Diagnosis not present

## 2023-12-08 DIAGNOSIS — E119 Type 2 diabetes mellitus without complications: Secondary | ICD-10-CM | POA: Diagnosis not present

## 2023-12-08 DIAGNOSIS — I10 Essential (primary) hypertension: Secondary | ICD-10-CM | POA: Diagnosis not present

## 2024-01-03 ENCOUNTER — Other Ambulatory Visit: Payer: Self-pay

## 2024-01-10 DIAGNOSIS — Z1231 Encounter for screening mammogram for malignant neoplasm of breast: Secondary | ICD-10-CM | POA: Diagnosis not present

## 2024-01-10 DIAGNOSIS — E1137X1 Type 2 diabetes mellitus with diabetic macular edema, resolved following treatment, right eye: Secondary | ICD-10-CM | POA: Diagnosis not present

## 2024-01-10 DIAGNOSIS — H2511 Age-related nuclear cataract, right eye: Secondary | ICD-10-CM | POA: Diagnosis not present

## 2024-01-10 DIAGNOSIS — E113493 Type 2 diabetes mellitus with severe nonproliferative diabetic retinopathy without macular edema, bilateral: Secondary | ICD-10-CM | POA: Diagnosis not present

## 2024-01-10 DIAGNOSIS — H35342 Macular cyst, hole, or pseudohole, left eye: Secondary | ICD-10-CM | POA: Diagnosis not present

## 2024-01-10 DIAGNOSIS — H353131 Nonexudative age-related macular degeneration, bilateral, early dry stage: Secondary | ICD-10-CM | POA: Diagnosis not present

## 2024-01-30 DIAGNOSIS — I10 Essential (primary) hypertension: Secondary | ICD-10-CM | POA: Diagnosis not present

## 2024-01-30 DIAGNOSIS — H547 Unspecified visual loss: Secondary | ICD-10-CM | POA: Diagnosis not present

## 2024-01-30 DIAGNOSIS — M545 Low back pain, unspecified: Secondary | ICD-10-CM | POA: Diagnosis not present

## 2024-01-30 DIAGNOSIS — E113493 Type 2 diabetes mellitus with severe nonproliferative diabetic retinopathy without macular edema, bilateral: Secondary | ICD-10-CM | POA: Diagnosis not present

## 2024-01-30 DIAGNOSIS — Z6826 Body mass index (BMI) 26.0-26.9, adult: Secondary | ICD-10-CM | POA: Diagnosis not present

## 2024-01-30 DIAGNOSIS — E7800 Pure hypercholesterolemia, unspecified: Secondary | ICD-10-CM | POA: Diagnosis not present

## 2024-04-07 ENCOUNTER — Other Ambulatory Visit: Payer: Self-pay
